# Patient Record
Sex: Female | Born: 1935 | Race: White | Hispanic: No | Smoking: Never smoker
Health system: Southern US, Community
[De-identification: ages and names within clinical notes are randomized; demographics above are authoritative.]

## PROBLEM LIST (undated history)

## (undated) DIAGNOSIS — E785 Hyperlipidemia, unspecified: Secondary | ICD-10-CM

## (undated) DIAGNOSIS — I10 Essential (primary) hypertension: Secondary | ICD-10-CM

## (undated) DIAGNOSIS — I714 Abdominal aortic aneurysm, without rupture, unspecified: Secondary | ICD-10-CM

## (undated) DIAGNOSIS — F0390 Unspecified dementia without behavioral disturbance: Secondary | ICD-10-CM

## (undated) DIAGNOSIS — R1310 Dysphagia, unspecified: Secondary | ICD-10-CM

## (undated) HISTORY — DX: Essential (primary) hypertension: I10

## (undated) HISTORY — DX: Abdominal aortic aneurysm, without rupture: I71.4

## (undated) HISTORY — PX: ABDOMINAL HYSTERECTOMY: SHX81

## (undated) HISTORY — DX: Unspecified dementia, unspecified severity, without behavioral disturbance, psychotic disturbance, mood disturbance, and anxiety: F03.90

## (undated) HISTORY — DX: Dysphagia, unspecified: R13.10

## (undated) HISTORY — DX: Abdominal aortic aneurysm, without rupture, unspecified: I71.40

## (undated) HISTORY — DX: Hyperlipidemia, unspecified: E78.5

---

## 1998-10-15 ENCOUNTER — Other Ambulatory Visit: Admission: RE | Admit: 1998-10-15 | Discharge: 1998-10-15 | Payer: Self-pay | Admitting: Obstetrics and Gynecology

## 1999-05-19 ENCOUNTER — Encounter: Payer: Self-pay | Admitting: Internal Medicine

## 1999-05-19 ENCOUNTER — Encounter: Admission: RE | Admit: 1999-05-19 | Discharge: 1999-05-19 | Payer: Self-pay | Admitting: Internal Medicine

## 1999-11-14 ENCOUNTER — Other Ambulatory Visit: Admission: RE | Admit: 1999-11-14 | Discharge: 1999-11-14 | Payer: Self-pay | Admitting: Obstetrics and Gynecology

## 2000-05-21 ENCOUNTER — Encounter: Admission: RE | Admit: 2000-05-21 | Discharge: 2000-05-21 | Payer: Self-pay | Admitting: Internal Medicine

## 2000-05-21 ENCOUNTER — Encounter: Payer: Self-pay | Admitting: Internal Medicine

## 2000-11-14 ENCOUNTER — Other Ambulatory Visit: Admission: RE | Admit: 2000-11-14 | Discharge: 2000-11-14 | Payer: Self-pay | Admitting: Obstetrics and Gynecology

## 2001-05-22 ENCOUNTER — Encounter: Payer: Self-pay | Admitting: Internal Medicine

## 2001-05-22 ENCOUNTER — Encounter: Admission: RE | Admit: 2001-05-22 | Discharge: 2001-05-22 | Payer: Self-pay | Admitting: Internal Medicine

## 2001-11-29 ENCOUNTER — Other Ambulatory Visit: Admission: RE | Admit: 2001-11-29 | Discharge: 2001-11-29 | Payer: Self-pay | Admitting: Obstetrics and Gynecology

## 2002-05-30 ENCOUNTER — Encounter: Admission: RE | Admit: 2002-05-30 | Discharge: 2002-05-30 | Payer: Self-pay | Admitting: Internal Medicine

## 2002-05-30 ENCOUNTER — Encounter: Payer: Self-pay | Admitting: Internal Medicine

## 2003-06-01 ENCOUNTER — Encounter: Admission: RE | Admit: 2003-06-01 | Discharge: 2003-06-01 | Payer: Self-pay | Admitting: Internal Medicine

## 2004-02-15 ENCOUNTER — Ambulatory Visit (HOSPITAL_COMMUNITY): Admission: RE | Admit: 2004-02-15 | Discharge: 2004-02-15 | Payer: Self-pay | Admitting: Gastroenterology

## 2004-06-02 ENCOUNTER — Encounter: Admission: RE | Admit: 2004-06-02 | Discharge: 2004-06-02 | Payer: Self-pay | Admitting: Internal Medicine

## 2005-07-03 ENCOUNTER — Encounter: Admission: RE | Admit: 2005-07-03 | Discharge: 2005-07-03 | Payer: Self-pay | Admitting: Internal Medicine

## 2006-07-04 ENCOUNTER — Encounter: Admission: RE | Admit: 2006-07-04 | Discharge: 2006-07-04 | Payer: Self-pay | Admitting: Internal Medicine

## 2007-07-08 ENCOUNTER — Encounter: Admission: RE | Admit: 2007-07-08 | Discharge: 2007-07-08 | Payer: Self-pay | Admitting: Internal Medicine

## 2008-07-08 ENCOUNTER — Encounter: Admission: RE | Admit: 2008-07-08 | Discharge: 2008-07-08 | Payer: Self-pay | Admitting: Internal Medicine

## 2009-07-09 ENCOUNTER — Encounter: Admission: RE | Admit: 2009-07-09 | Discharge: 2009-07-09 | Payer: Self-pay | Admitting: Internal Medicine

## 2010-07-11 ENCOUNTER — Encounter
Admission: RE | Admit: 2010-07-11 | Discharge: 2010-07-11 | Payer: Self-pay | Source: Home / Self Care | Attending: Internal Medicine | Admitting: Internal Medicine

## 2010-12-16 NOTE — Op Note (Signed)
NAME:  Amanda Hurley, Amanda Hurley                            ACCOUNT NO.:  [id number]   MEDICAL RECORD NO.:  [id number]                   PATIENT TYPE:  AMB   LOCATION:  ENDO                                 FACILITY:  [facility]   PHYSICIAN:  Danise Edge, M.D.                DATE OF BIRTH:  Dec 02, 1928   DATE OF PROCEDURE:  02/15/2004  DATE OF DISCHARGE:                                 OPERATIVE REPORT   PROCEDURE:  Incomplete colonoscopy.   REFERRED BY:  Candyce Churn, M.D.   INDICATIONS FOR PROCEDURE:  Amanda Hurley is a 77 year old female born  July 31, 1929.  Amanda Hurley is referred for a screening colonoscopy with  polypectomy to prevent colon cancer.   ENDOSCOPIST:  Danise Edge, M.D.   PREMEDICATION:  Versed 6 mg, Demerol 50 mg.   DESCRIPTION OF PROCEDURE:  After obtaining informed consent, Amanda Hurley was  placed in the left lateral decubitus position. I administered intravenous  Demerol and intravenous Versed to achieve conscious sedation for the  procedure. The patient's blood pressure, oxygen saturation and cardiac  rhythm were monitored throughout the procedure and documented in the medical  record.   Anal inspection and digital rectal exam were normal. The Olympus adjustable  pediatric colonoscope was introduced into the rectum and advanced only to  approximately the splenic flexure. Due to sigmoid colon loop formation, I  was unable to complete a full colonoscopy.  Colonic preparation for the exam  today was satisfactory.   Amanda Hurley has left colonic diverticulosis.  Otherwise endoscopic appearance  of the rectum, sigmoid colon and descending colon was normal.   ASSESSMENT:  1. Incomplete colonoscopy to the splenic flexure.  2. Left colonic diverticulosis.  3. I was unable to examine the transverse colon, hepatic flexure, ascending     colon, cecum and ileocecal valve.   RECOMMENDATIONS:  Schedule air contrast barium enema if it is important for  Amanda Hurley to have  visualization of the entire colon.                                               Danise Edge, M.D.    MJ/MEDQ  D:  02/15/2004  T:  02/16/2004  Job:  [id number]   cc:   Candyce Churn, M.D.  [address] [place]  [place] [address]  Fax: [phone]

## 2011-07-07 ENCOUNTER — Other Ambulatory Visit: Payer: Self-pay | Admitting: Internal Medicine

## 2011-07-07 DIAGNOSIS — Z1231 Encounter for screening mammogram for malignant neoplasm of breast: Secondary | ICD-10-CM

## 2011-07-13 ENCOUNTER — Ambulatory Visit
Admission: RE | Admit: 2011-07-13 | Discharge: 2011-07-13 | Disposition: A | Discharge status: Home / Self Care | Payer: Medicare Other | Source: Ambulatory Visit | Attending: Internal Medicine | Admitting: Internal Medicine

## 2011-07-13 DIAGNOSIS — Z1231 Encounter for screening mammogram for malignant neoplasm of breast: Secondary | ICD-10-CM

## 2012-06-06 ENCOUNTER — Other Ambulatory Visit: Payer: Self-pay | Admitting: Internal Medicine

## 2012-06-06 DIAGNOSIS — Z1231 Encounter for screening mammogram for malignant neoplasm of breast: Secondary | ICD-10-CM

## 2012-07-15 ENCOUNTER — Ambulatory Visit
Admission: RE | Admit: 2012-07-15 | Discharge: 2012-07-15 | Disposition: A | Discharge status: Home / Self Care | Payer: Medicare Other | Source: Ambulatory Visit | Attending: Internal Medicine | Admitting: Internal Medicine

## 2012-07-15 DIAGNOSIS — Z1231 Encounter for screening mammogram for malignant neoplasm of breast: Secondary | ICD-10-CM

## 2013-06-16 ENCOUNTER — Other Ambulatory Visit: Payer: Self-pay

## 2013-06-16 DIAGNOSIS — Z1231 Encounter for screening mammogram for malignant neoplasm of breast: Secondary | ICD-10-CM

## 2013-07-16 ENCOUNTER — Ambulatory Visit
Admission: RE | Admit: 2013-07-16 | Discharge: 2013-07-16 | Disposition: A | Discharge status: Home / Self Care | Payer: Medicare Other | Source: Ambulatory Visit

## 2013-07-16 DIAGNOSIS — Z1231 Encounter for screening mammogram for malignant neoplasm of breast: Secondary | ICD-10-CM

## 2014-02-02 ENCOUNTER — Other Ambulatory Visit: Payer: Self-pay | Admitting: Internal Medicine

## 2014-02-02 ENCOUNTER — Ambulatory Visit
Admission: RE | Admit: 2014-02-02 | Discharge: 2014-02-02 | Disposition: A | Discharge status: Home / Self Care | Payer: Medicare Other | Source: Ambulatory Visit | Attending: Internal Medicine | Admitting: Internal Medicine

## 2014-02-02 DIAGNOSIS — R0989 Other specified symptoms and signs involving the circulatory and respiratory systems: Secondary | ICD-10-CM

## 2014-03-09 ENCOUNTER — Other Ambulatory Visit: Payer: Self-pay | Admitting: Internal Medicine

## 2014-03-09 ENCOUNTER — Ambulatory Visit
Admission: RE | Admit: 2014-03-09 | Discharge: 2014-03-09 | Disposition: A | Discharge status: Home / Self Care | Payer: Medicare Other | Source: Ambulatory Visit | Attending: Internal Medicine | Admitting: Internal Medicine

## 2014-03-09 DIAGNOSIS — R9389 Abnormal findings on diagnostic imaging of other specified body structures: Secondary | ICD-10-CM

## 2014-06-18 ENCOUNTER — Other Ambulatory Visit: Payer: Self-pay

## 2014-06-18 DIAGNOSIS — Z1231 Encounter for screening mammogram for malignant neoplasm of breast: Secondary | ICD-10-CM

## 2014-07-20 ENCOUNTER — Ambulatory Visit
Admission: RE | Admit: 2014-07-20 | Discharge: 2014-07-20 | Disposition: A | Discharge status: Home / Self Care | Payer: Medicare Other | Source: Ambulatory Visit

## 2014-07-20 DIAGNOSIS — Z1231 Encounter for screening mammogram for malignant neoplasm of breast: Secondary | ICD-10-CM

## 2014-10-21 ENCOUNTER — Ambulatory Visit (INDEPENDENT_AMBULATORY_CARE_PROVIDER_SITE_OTHER): Payer: Medicare Other | Admitting: Cardiovascular Disease

## 2014-10-21 ENCOUNTER — Encounter: Payer: Self-pay | Admitting: Cardiovascular Disease

## 2014-10-21 VITALS — BP 120/70 | HR 74 | Ht 67.0 in | Wt 146.1 lb

## 2014-10-21 DIAGNOSIS — R079 Chest pain, unspecified: Secondary | ICD-10-CM | POA: Diagnosis not present

## 2014-10-21 NOTE — Patient Instructions (Signed)
Your physician recommends that you continue on your current medications as directed. Please refer to the Current Medication list given to you today.  Follow up with Dr. Nahser as needed. 

## 2014-10-21 NOTE — Progress Notes (Signed)
Cardiology Office Note   Date:  10/21/2014   ID:  Amanda Sleepereggy K Dube, DOB 05/04/1929, MRN [id number]  PCP:  No primary care provider on file.  Cardiologist:   Kelden Lavallee, Deloris PingPhilip J, MD   Chief Complaint  Patient presents with  . Chest Pain   Problem list 1. Chest heaviness 2. Hypertension 3. Hyperlipidemia 4. Mild carotid artery disease 5. Anxiety   October 21, 2014: Amanda Hurley is a 81 y.o. female who presents for evaluation of some chest tightness. She thinks its a lung issue on her right side. She notices some issues when she is lying down.  Has been present for a month or so. Pain is worse at night.  Was started on antacid and has cut back on her OJ and the pains have dramatically improved  Pains never occur with exertion. Almost always occur with lying down.  Lives on her own, still driving, does her own shopping .    Past Medical History  Diagnosis Date  . Hypertension   . Hyperlipidemia   . AAA (abdominal aortic aneurysm)     History reviewed. No pertinent past surgical history.   Current Outpatient Prescriptions  Medication Sig Dispense Refill  . CALCIUM-VITAMIN D PO Take by mouth daily.    . Cranberry 450 MG CAPS Take 450 mg by mouth daily.    Marland Kitchen. losartan-hydrochlorothiazide (HYZAAR) 50-12.5 MG per tablet Take by mouth daily.  11  . Multiple Vitamin (MULTIVITAMIN) tablet Take 1 tablet by mouth daily.    Marland Kitchen. NITROSTAT 0.4 MG SL tablet Take 0.4 mg by mouth daily as needed. For chest pain  1  . Omega-3 Fatty Acids (FISH OIL) 1200 MG CAPS Take 1,200 mg by mouth daily.    Marland Kitchen. omeprazole (PRILOSEC) 40 MG capsule Take 40 mg by mouth daily.  1  . PROAIR HFA 108 (90 BASE) MCG/ACT inhaler Inhale into the lungs daily as needed. 2 puffs every 4-6 hours as needed for wheezing and SOB  0   No current facility-administered medications for this visit.    Allergies:   Review of patient's allergies indicates no known allergies.    Social History:  The patient  reports that she  has never smoked. She does not have any smokeless tobacco history on file. She reports that she drinks alcohol. She reports that she does not use illicit drugs.   Family History:  The patient's family history includes CAD in her father; Cancer - Other in her brother; Dementia in her brother; Hypertension in her mother; Leukemia in her brother.    ROS:  Please see the history of present illness.    Review of Systems: Constitutional:  denies fever, chills, diaphoresis, appetite change and fatigue.  HEENT: denies photophobia, eye pain, redness, hearing loss, ear pain, congestion, sore throat, rhinorrhea, sneezing, neck pain, neck stiffness and tinnitus.  Respiratory: denies SOB, DOE, cough, chest tightness, and wheezing.  Has some allergies and coughs on occasion   Cardiovascular: denies chest pain, palpitations and leg swelling.  Gastrointestinal: denies nausea, vomiting, abdominal pain, diarrhea, constipation, blood in stool.  Genitourinary: denies dysuria, urgency, frequency, hematuria, flank pain and difficulty urinating.  Musculoskeletal: denies  myalgias, back pain, joint swelling, arthralgias and gait problem.   Skin: denies pallor, rash and wound.  Neurological: denies dizziness, seizures, syncope, weakness, light-headedness, numbness and headaches.   Hematological: denies adenopathy, easy bruising, personal or family bleeding history.  Psychiatric/ Behavioral: denies suicidal ideation, mood changes, confusion, nervousness, sleep disturbance and agitation.  All other systems are reviewed and negative.    PHYSICAL EXAM: VS:  BP 120/70 mmHg  Pulse 74  Ht  (1.702 m)  Wt 146 lb 1.9 oz (66.28 kg)  BMI 22.88 kg/m2 , BMI Body mass index is 22.88 kg/(m^2). GEN: Well nourished, well developed, in no acute distress HEENT: normal Neck: no JVD, carotid bruits, or masses Cardiac: RRR; no murmurs, rubs, or gallops,no edema  Respiratory:  clear to auscultation bilaterally, normal  work of breathing GI: soft, nontender, nondistended, + BS MS: no deformity or atrophy Skin: warm and dry, no rash Neuro:  Strength and sensation are intact Psych: normal   EKG:  EKG is ordered today. The ekg ordered today demonstrates NSR at 74.  Normal ecg.    Recent Labs: No results found for requested labs within last 365 days.    Lipid Panel No results found for: CHOL, TRIG, HDL, CHOLHDL, VLDL, LDLCALC, LDLDIRECT    Wt Readings from Last 3 Encounters:  10/21/14 146 lb 1.9 oz (66.28 kg)      Other studies Reviewed: Additional studies/ records that were reviewed today include:  Records from Dr. Kevan Ny Review of the above records demonstrates:  Memory issues, atypical CP    ASSESSMENT AND PLAN:  1.  Atypical chest pain :  This is presents with some atypical chest pains. The chest pains typically would occur at night when she lies down. They were never associated with exertion. The pains have resolved after starting an antacid and after she cut back on the amount of orange juice that she drinks.  Resting EKG is normal.  At this point I think that these pains are GI. I do not think there is any indication to do any further cardiac workup at this time. I'll see her on an as-needed basis.     Current medicines are reviewed at length with the patient today.  The patient does not have concerns regarding medicines.  The following changes have been made:  no change  Labs/ tests ordered today include:  No orders of the defined types were placed in this encounter.     Disposition:   FU with me as needed.     Signed, Travez Stancil, Deloris Ping, MD  10/21/2014 12:04 PM    [facility] [address] [place], [place], [place]  [address] Phone: [phone]; Fax: [phone]

## 2015-06-22 ENCOUNTER — Other Ambulatory Visit: Payer: Self-pay

## 2015-06-22 DIAGNOSIS — Z1231 Encounter for screening mammogram for malignant neoplasm of breast: Secondary | ICD-10-CM

## 2015-07-27 ENCOUNTER — Ambulatory Visit
Admission: RE | Admit: 2015-07-27 | Discharge: 2015-07-27 | Disposition: A | Discharge status: Home / Self Care | Payer: Medicare Other | Source: Ambulatory Visit

## 2015-07-27 DIAGNOSIS — Z1231 Encounter for screening mammogram for malignant neoplasm of breast: Secondary | ICD-10-CM

## 2015-07-28 ENCOUNTER — Other Ambulatory Visit: Payer: Self-pay | Admitting: Internal Medicine

## 2015-07-28 DIAGNOSIS — R928 Other abnormal and inconclusive findings on diagnostic imaging of breast: Secondary | ICD-10-CM

## 2015-08-06 ENCOUNTER — Ambulatory Visit
Admission: RE | Admit: 2015-08-06 | Discharge: 2015-08-06 | Disposition: A | Discharge status: Home / Self Care | Payer: Medicare Other | Source: Ambulatory Visit | Attending: Internal Medicine | Admitting: Internal Medicine

## 2015-08-06 DIAGNOSIS — R928 Other abnormal and inconclusive findings on diagnostic imaging of breast: Secondary | ICD-10-CM

## 2015-09-28 ENCOUNTER — Other Ambulatory Visit: Payer: Self-pay | Admitting: Internal Medicine

## 2015-09-28 DIAGNOSIS — R1310 Dysphagia, unspecified: Secondary | ICD-10-CM

## 2015-10-06 ENCOUNTER — Ambulatory Visit
Admission: RE | Admit: 2015-10-06 | Discharge: 2015-10-06 | Disposition: A | Discharge status: Home / Self Care | Payer: Medicare Other | Source: Ambulatory Visit | Attending: Internal Medicine | Admitting: Internal Medicine

## 2015-10-06 DIAGNOSIS — R1310 Dysphagia, unspecified: Secondary | ICD-10-CM

## 2015-10-15 ENCOUNTER — Encounter (HOSPITAL_COMMUNITY): Payer: Self-pay

## 2015-10-15 ENCOUNTER — Emergency Department (HOSPITAL_COMMUNITY)
Admission: EM | Admit: 2015-10-15 | Discharge: 2015-10-16 | Disposition: A | Discharge status: Home / Self Care | Payer: Medicare Other | Attending: Emergency Medicine | Admitting: Emergency Medicine

## 2015-10-15 DIAGNOSIS — I10 Essential (primary) hypertension: Secondary | ICD-10-CM | POA: Insufficient documentation

## 2015-10-15 DIAGNOSIS — Y998 Other external cause status: Secondary | ICD-10-CM | POA: Diagnosis not present

## 2015-10-15 DIAGNOSIS — S62309A Unspecified fracture of unspecified metacarpal bone, initial encounter for closed fracture: Secondary | ICD-10-CM

## 2015-10-15 DIAGNOSIS — E785 Hyperlipidemia, unspecified: Secondary | ICD-10-CM | POA: Diagnosis not present

## 2015-10-15 DIAGNOSIS — S0990XA Unspecified injury of head, initial encounter: Secondary | ICD-10-CM

## 2015-10-15 DIAGNOSIS — Y9389 Activity, other specified: Secondary | ICD-10-CM | POA: Insufficient documentation

## 2015-10-15 DIAGNOSIS — S62313A Displaced fracture of base of third metacarpal bone, left hand, initial encounter for closed fracture: Secondary | ICD-10-CM | POA: Insufficient documentation

## 2015-10-15 DIAGNOSIS — Z79899 Other long term (current) drug therapy: Secondary | ICD-10-CM | POA: Diagnosis not present

## 2015-10-15 DIAGNOSIS — S0181XA Laceration without foreign body of other part of head, initial encounter: Secondary | ICD-10-CM | POA: Insufficient documentation

## 2015-10-15 DIAGNOSIS — W01198A Fall on same level from slipping, tripping and stumbling with subsequent striking against other object, initial encounter: Secondary | ICD-10-CM | POA: Insufficient documentation

## 2015-10-15 DIAGNOSIS — W19XXXA Unspecified fall, initial encounter: Secondary | ICD-10-CM

## 2015-10-15 DIAGNOSIS — Y92009 Unspecified place in unspecified non-institutional (private) residence as the place of occurrence of the external cause: Secondary | ICD-10-CM | POA: Diagnosis not present

## 2015-10-15 DIAGNOSIS — S62311A Displaced fracture of base of second metacarpal bone. left hand, initial encounter for closed fracture: Secondary | ICD-10-CM | POA: Insufficient documentation

## 2015-10-15 NOTE — ED Notes (Signed)
Per EMS- Pt had unwitnessed fall tonight. Laceration to head- LOC. Pt doesn't remember falling. Generalized weakness. 12 lead unremarkable. No neck or back pain. Hematoma over left orbital. 116/82 76 123cbg. 97.

## 2015-10-15 NOTE — ED Notes (Signed)
Bed: WA04 Expected date:  Expected time:  Means of arrival:  Comments: EMS elderly fall/hematoma

## 2015-10-15 NOTE — ED Notes (Addendum)
Pt. States that she remembers falling, but says she felt weak. Pt states she fell while turning around, lost her balance.

## 2015-10-16 ENCOUNTER — Emergency Department (HOSPITAL_COMMUNITY): Payer: Medicare Other

## 2015-10-16 LAB — CBC WITH DIFFERENTIAL/PLATELET
BASOS ABS: 0 10*3/uL (ref 0.0–0.1)
BASOS PCT: 0 %
EOS PCT: 0 %
Eosinophils Absolute: 0 10*3/uL (ref 0.0–0.7)
HEMATOCRIT: 37 % (ref 36.0–46.0)
Hemoglobin: 12.1 g/dL (ref 12.0–15.0)
LYMPHS PCT: 10 %
Lymphs Abs: 1.7 10*3/uL (ref 0.7–4.0)
MCH: 30 pg (ref 26.0–34.0)
MCHC: 32.7 g/dL (ref 30.0–36.0)
MCV: 91.8 fL (ref 78.0–100.0)
Monocytes Absolute: 1.4 10*3/uL — ABNORMAL HIGH (ref 0.1–1.0)
Monocytes Relative: 8 %
NEUTROS ABS: 13.6 10*3/uL — AB (ref 1.7–7.7)
Neutrophils Relative %: 82 %
PLATELETS: 301 10*3/uL (ref 150–400)
RBC: 4.03 MIL/uL (ref 3.87–5.11)
RDW: 12.7 % (ref 11.5–15.5)
WBC: 16.8 10*3/uL — AB (ref 4.0–10.5)

## 2015-10-16 LAB — BASIC METABOLIC PANEL
ANION GAP: 9 (ref 5–15)
BUN: 23 mg/dL — ABNORMAL HIGH (ref 6–20)
CALCIUM: 9.4 mg/dL (ref 8.9–10.3)
CO2: 25 mmol/L (ref 22–32)
Chloride: 97 mmol/L — ABNORMAL LOW (ref 101–111)
Creatinine, Ser: 1.08 mg/dL — ABNORMAL HIGH (ref 0.44–1.00)
GFR calc Af Amer: 53 mL/min — ABNORMAL LOW (ref >60)
GFR, EST NON AFRICAN AMERICAN: 46 mL/min — AB (ref >60)
Glucose, Bld: 122 mg/dL — ABNORMAL HIGH (ref 65–99)
POTASSIUM: 4.2 mmol/L (ref 3.5–5.1)
SODIUM: 131 mmol/L — AB (ref 135–145)

## 2015-10-16 MED ORDER — LIDOCAINE-EPINEPHRINE 1 %-1:100000 IJ SOLN
10.0000 mL | Freq: Once | INTRAMUSCULAR | Status: AC
  Administered 2015-10-16: 10 mL via INTRADERMAL
  Filled 2015-10-16: qty 1

## 2015-10-16 NOTE — ED Notes (Signed)
Went to draw blood on pt in room 4, when I was stopped by the family.  Family would like to have blood drawn from the IV.  Informed the nurse.

## 2015-10-16 NOTE — ED Provider Notes (Signed)
CSN: [id number]     Arrival date & time 10/15/15  2309 History  By signing my name below, I, Marisue Humble, attest that this documentation has been prepared under the direction and in the presence of Geoffery Lyons, MD . Electronically Signed: Marisue Humble, Scribe. 10/16/2015. 2:59 AM.   Chief Complaint  Patient presents with  . Fall   The history is provided by the patient and a relative. No language interpreter was used.   HPI Comments:  Amanda Hurley is a 82 y.o. female with PMHx of HTN, HLD, and AAA who presents to the Emergency Department via EMS s/p unwitnessed fall earlier tonight complaining of laceration. Pt states she was turning around when she lost her balance and fell. She hit her head on a door and slid to the floor. Family reports similar fall ~1 wk ago due to loss of balance while in the yard. Pt reports associated soreness in left hand. Pt finished an antibiotic regimen for UTI ~1 wk ago; relative also reports pt had swallow study done recently to evaluate possible diverticulum in esophagus. Pt denies syncope, neck pain, headache, hip or knee pain, use of blood thinners, or difficulty breathing.  Past Medical History  Diagnosis Date  . Hypertension   . Hyperlipidemia   . AAA (abdominal aortic aneurysm) (HCC)    History reviewed. No pertinent past surgical history. Family History  Problem Relation Age of Onset  . Hypertension Mother   . CAD Father   . Dementia Brother   . Cancer - Other Brother   . Leukemia Brother    Social History  Substance Use Topics  . Smoking status: Never Smoker   . Smokeless tobacco: None  . Alcohol Use: 0.0 oz/week    0 Standard drinks or equivalent per week     Comment: HISTORY OF   OB History    No data available     Review of Systems  Respiratory: Negative for shortness of breath.   Musculoskeletal: Positive for arthralgias (left hand). Negative for neck pain.  Skin: Positive for wound (above left eye).  Neurological:  Negative for syncope and headaches.  All other systems reviewed and are negative.  Allergies  Review of patient's allergies indicates no known allergies.  Home Medications   Prior to Admission medications   Medication Sig Start Date End Date Taking? Authorizing Provider  Cranberry 450 MG CAPS Take 450 mg by mouth daily.   Yes Historical Provider, MD  losartan-hydrochlorothiazide Mauri Reading) 50-12.5 MG per tablet Take 1 tablet by mouth every Monday, Wednesday, and Friday.  10/03/14  Yes Historical Provider, MD  Multiple Vitamin (MULTIVITAMIN) tablet Take 1 tablet by mouth daily.   Yes Historical Provider, MD  Omega-3 Fatty Acids (FISH OIL) 1200 MG CAPS Take 1,200 mg by mouth daily.   Yes Historical Provider, MD  omeprazole (PRILOSEC) 40 MG capsule Take 40 mg by mouth daily. 09/21/14  Yes Historical Provider, MD  NITROSTAT 0.4 MG SL tablet Take 0.4 mg by mouth daily as needed. For chest pain 09/21/14   Historical Provider, MD   BP 128/83 mmHg  Pulse 87  Temp(Src) 97.5 F (36.4 C) (Oral)  Resp 18  SpO2 98% Physical Exam  Constitutional: She is oriented to person, place, and time. She appears well-developed and well-nourished. No distress.  HENT:  Head: Normocephalic.  Mouth/Throat: Oropharynx is clear and moist. No oropharyngeal exudate.  2.5 cm laceration above the left eyebrow.  Eyes: Conjunctivae and EOM are normal. Pupils are equal, round, and  reactive to light. Right eye exhibits no discharge. Left eye exhibits no discharge. No scleral icterus.  Neck: Normal range of motion. Neck supple. No JVD present. No thyromegaly present.  Cardiovascular: Normal rate, regular rhythm, normal heart sounds and intact distal pulses.  Exam reveals no gallop and no friction rub.   No murmur heard. Pulmonary/Chest: Effort normal and breath sounds normal. No respiratory distress. She has no wheezes. She has no rales.  Abdominal: Soft. Bowel sounds are normal. She exhibits no distension and no mass. There is  no tenderness.  Musculoskeletal: Normal range of motion. She exhibits no edema or tenderness.  Lymphadenopathy:    She has no cervical adenopathy.  Neurological: She is alert and oriented to person, place, and time. No cranial nerve deficit. Coordination normal.  Skin: Skin is warm and dry. No rash noted. She is not diaphoretic. No erythema.  Psychiatric: She has a normal mood and affect. Her behavior is normal.  Nursing note and vitals reviewed.  ED Course  Procedures  DIAGNOSTIC STUDIES:  Oxygen Saturation is 98% on RA, normal by my interpretation.    COORDINATION OF CARE:  12:38 AM Will order x-ray of left hand, head CT and blood work. Discussed treatment plan with pt at bedside and pt agreed to plan.  1:46 AM LACERATION REPAIR Performed by: Geoffery Lyonsouglas Varnell Donate, MD Consent: Verbal consent obtained. Risks and benefits: risks, benefits and alternatives were discussed Patient identity confirmed: provided demographic data Time out performed prior to procedure Prepped and Draped in normal sterile fashion Wound explored Laceration Location: above left eyebrow Laceration Length: 2.5 cm No Foreign Bodies seen or palpated Anesthesia: local infiltration Local anesthetic: lidocaine 1% 1:00000 epinephrine Anesthetic total: 10 ml Irrigation method: syringe Amount of cleaning: standard Skin closure: 6 o prolene Number of sutures or staples: 3 Technique: simple interrupted  Patient tolerance: Patient tolerated the procedure well with no immediate complications.   Labs Review Labs Reviewed  BASIC METABOLIC PANEL - Abnormal; Notable for the following:    Sodium 131 (*)    Chloride 97 (*)    Glucose, Bld 122 (*)    BUN 23 (*)    Creatinine, Ser 1.08 (*)    GFR calc non Af Amer 46 (*)    GFR calc Af Amer 53 (*)    All other components within normal limits  CBC WITH DIFFERENTIAL/PLATELET - Abnormal; Notable for the following:    WBC 16.8 (*)    Neutro Abs 13.6 (*)    Monocytes  Absolute 1.4 (*)    All other components within normal limits    Imaging Review Ct Head Wo Contrast  10/16/2015  CLINICAL DATA:  Status post unwitnessed fall, with laceration to the head. Loss of consciousness. Generalized weakness. Initial encounter. EXAM: CT HEAD WITHOUT CONTRAST TECHNIQUE: Contiguous axial images were obtained from the base of the skull through the vertex without intravenous contrast. COMPARISON:  None. FINDINGS: There is no evidence of acute infarction, mass lesion, or intra- or extra-axial hemorrhage on CT. Prominence of the ventricles and sulci reflects moderate cortical volume loss. Cerebellar atrophy is noted. Diffuse periventricular and subcortical white matter change likely reflects small vessel ischemic microangiopathy. A chronic lacunar infarct is noted at the right basal ganglia. The brainstem and fourth ventricle are within normal limits. The cerebral hemispheres demonstrate grossly normal gray-white differentiation. No mass effect or midline shift is seen. There is no evidence of fracture; visualized osseous structures are unremarkable in appearance. The visualized portions of the orbits are within  normal limits. The paranasal sinuses and mastoid air cells are well-aerated. Mild soft tissue swelling is noted overlying the left frontal calvarium. IMPRESSION: 1. No evidence of traumatic intracranial injury or fracture. 2. Mild soft tissue swelling overlying the left frontal calvarium. 3. Moderate cortical volume loss and diffuse small vessel ischemic microangiopathy. 4. Chronic lacunar infarct at the right basal ganglia. Electronically Signed   By: Roanna Raider M.D.   On: 10/16/2015 02:13   Dg Hand Complete Left  10/16/2015  CLINICAL DATA:  Unwitnessed fall tonight. Pain and erythema to the left hand. EXAM: LEFT HAND - COMPLETE 3+ VIEW COMPARISON:  None. FINDINGS: There are acute nondisplaced fractures of the bases of the second and third metacarpals, probably intra-articular  at the carpometacarpal articulations. No dislocation is evident. There is no radiopaque foreign body. There are osteoarthritic changes of the first [facility] joint. IMPRESSION: Acute fractures of the second and third metacarpal bases. Electronically Signed   By: Ellery Plunk M.D.   On: 10/16/2015 01:55   I have personally reviewed and evaluated these images and lab results as part of my medical decision-making.    MDM   Final diagnoses:  Fall, initial encounter  Forehead laceration, initial encounter  Closed head injury, initial encounter  Metacarpal bone fracture, closed, initial encounter    Patient presents after a fall at home. She states that she turned too quickly, lost her balance, then fell forward striking her head on the door. She denies any loss of consciousness and denies any neck pain. Her neurologic exam is nonfocal and there is no tenderness of the cervical spine. She has painless range of motion in all directions and her C-spine was cleared clinically. A head CT was obtained which was unremarkable for bleed or fracture. She was found to have fractures of the second and third metacarpal of her left hand. These were splinted and will be followed up by hand surgery. Her forehead laceration was repaired. She appears very appropriate for discharge. To return as needed for any problems.  I personally performed the services described in this documentation, which was scribed in my presence. The recorded information has been reviewed and is accurate.       Geoffery Lyons, MD 10/16/15 [id number]

## 2015-10-16 NOTE — Progress Notes (Signed)
Orthopedic Tech Progress Note Patient Details:  Amanda Hurley 10/09/1928 [id number]  Ortho Devices Type of Ortho Device: Volar splint Ortho Device/Splint Location: lue Ortho Device/Splint Interventions: Ordered, Application   [name], Amanda Hurley 10/16/2015, 3:41 AM

## 2015-10-16 NOTE — Discharge Instructions (Signed)
Local wound care with bacitracin and dressing changes twice daily.  Sutures are to be removed in 5 days. Please see your primary doctor for this.  Follow-up with Dr. Melvyn Novasrtmann in the surgery clinic this week for further treatment of your hand fractures.  Wear the splint as applied in the meantime. Ice your hand for 20 minutes every 2 hours while awake for the next 2 days.   Facial Laceration  A facial laceration is a cut on the face. These injuries can be painful and cause bleeding. Lacerations usually heal quickly, but they need special care to reduce scarring. DIAGNOSIS  Your health care provider will take a medical history, ask for details about how the injury occurred, and examine the wound to determine how deep the cut is. TREATMENT  Some facial lacerations may not require closure. Others may not be able to be closed because of an increased risk of infection. The risk of infection and the chance for successful closure will depend on various factors, including the amount of time since the injury occurred. The wound may be cleaned to help prevent infection. If closure is appropriate, pain medicines may be given if needed. Your health care provider will use stitches (sutures), wound glue (adhesive), or skin adhesive strips to repair the laceration. These tools bring the skin edges together to allow for faster healing and a better cosmetic outcome. If needed, you may also be given a tetanus shot. HOME CARE INSTRUCTIONS  Only take over-the-counter or prescription medicines as directed by your health care provider.  Follow your health care provider's instructions for wound care. These instructions will vary depending on the technique used for closing the wound. For Sutures:  Keep the wound clean and dry.   If you were given a bandage (dressing), you should change it at least once a day. Also change the dressing if it becomes wet or dirty, or as directed by your health care provider.   Wash  the wound with soap and water 2 times a day. Rinse the wound off with water to remove all soap. Pat the wound dry with a clean towel.   After cleaning, apply a thin layer of the antibiotic ointment recommended by your health care provider. This will help prevent infection and keep the dressing from sticking.   You may shower as usual after the first 24 hours. Do not soak the wound in water until the sutures are removed.   Get your sutures removed as directed by your health care provider. With facial lacerations, sutures should usually be taken out after 4-5 days to avoid stitch marks.   Wait a few days after your sutures are removed before applying any makeup. For Skin Adhesive Strips:  Keep the wound clean and dry.   Do not get the skin adhesive strips wet. You may bathe carefully, using caution to keep the wound dry.   If the wound gets wet, pat it dry with a clean towel.   Skin adhesive strips will fall off on their own. You may trim the strips as the wound heals. Do not remove skin adhesive strips that are still stuck to the wound. They will fall off in time.  For Wound Adhesive:  You may briefly wet your wound in the shower or bath. Do not soak or scrub the wound. Do not swim. Avoid periods of heavy sweating until the skin adhesive has fallen off on its own. After showering or bathing, gently pat the wound dry with a clean towel.  Do not apply liquid medicine, cream medicine, ointment medicine, or makeup to your wound while the skin adhesive is in place. This may loosen the film before your wound is healed.   If a dressing is placed over the wound, be careful not to apply tape directly over the skin adhesive. This may cause the adhesive to be pulled off before the wound is healed.   Avoid prolonged exposure to sunlight or tanning lamps while the skin adhesive is in place.  The skin adhesive will usually remain in place for 5-10 days, then naturally fall off the skin. Do  not pick at the adhesive film.  After Healing: Once the wound has healed, cover the wound with sunscreen during the day for 1 full year. This can help minimize scarring. Exposure to ultraviolet light in the first year will darken the scar. It can take 1-2 years for the scar to lose its redness and to heal completely.  SEEK MEDICAL CARE IF:  You have a fever. SEEK IMMEDIATE MEDICAL CARE IF:  You have redness, pain, or swelling around the wound.   You see ayellowish-white fluid (pus) coming from the wound.    This information is not intended to replace advice given to you by your health care provider. Make sure you discuss any questions you have with your health care provider.   Document Released: 08/24/2004 Document Revised: 08/07/2014 Document Reviewed: 02/27/2013 Elsevier Interactive Patient Education [employer]

## 2016-02-11 ENCOUNTER — Emergency Department (HOSPITAL_COMMUNITY): Payer: Medicare Other

## 2016-02-11 ENCOUNTER — Emergency Department (HOSPITAL_COMMUNITY)
Admission: EM | Admit: 2016-02-11 | Discharge: 2016-02-11 | Disposition: A | Discharge status: Home / Self Care | Payer: Medicare Other | Attending: Emergency Medicine | Admitting: Emergency Medicine

## 2016-02-11 ENCOUNTER — Encounter (HOSPITAL_COMMUNITY): Payer: Self-pay | Admitting: Emergency Medicine

## 2016-02-11 DIAGNOSIS — M545 Low back pain, unspecified: Secondary | ICD-10-CM

## 2016-02-11 DIAGNOSIS — Z7982 Long term (current) use of aspirin: Secondary | ICD-10-CM | POA: Diagnosis not present

## 2016-02-11 DIAGNOSIS — Z79899 Other long term (current) drug therapy: Secondary | ICD-10-CM | POA: Diagnosis not present

## 2016-02-11 DIAGNOSIS — I10 Essential (primary) hypertension: Secondary | ICD-10-CM | POA: Insufficient documentation

## 2016-02-11 DIAGNOSIS — W1839XA Other fall on same level, initial encounter: Secondary | ICD-10-CM | POA: Diagnosis not present

## 2016-02-11 DIAGNOSIS — Y999 Unspecified external cause status: Secondary | ICD-10-CM | POA: Diagnosis not present

## 2016-02-11 DIAGNOSIS — Y9289 Other specified places as the place of occurrence of the external cause: Secondary | ICD-10-CM | POA: Insufficient documentation

## 2016-02-11 DIAGNOSIS — Y9389 Activity, other specified: Secondary | ICD-10-CM | POA: Diagnosis not present

## 2016-02-11 DIAGNOSIS — W19XXXA Unspecified fall, initial encounter: Secondary | ICD-10-CM

## 2016-02-11 LAB — URINALYSIS, ROUTINE W REFLEX MICROSCOPIC
BILIRUBIN URINE: NEGATIVE
Glucose, UA: NEGATIVE mg/dL
Hgb urine dipstick: NEGATIVE
KETONES UR: NEGATIVE mg/dL
Nitrite: NEGATIVE
PROTEIN: NEGATIVE mg/dL
Specific Gravity, Urine: 1.018 (ref 1.005–1.030)
pH: 6 (ref 5.0–8.0)

## 2016-02-11 LAB — URINE MICROSCOPIC-ADD ON

## 2016-02-11 MED ORDER — ACETAMINOPHEN 325 MG PO TABS: 650.0000 mg | ORAL_TABLET | Freq: Four times a day (QID) | ORAL | Status: DC | PRN

## 2016-02-11 MED ORDER — HYDROCODONE-ACETAMINOPHEN 5-325 MG PO TABS: 0.5000 | ORAL_TABLET | Freq: Four times a day (QID) | ORAL | Status: DC | PRN

## 2016-02-11 MED ORDER — ACETAMINOPHEN 325 MG PO TABS
650.0000 mg | ORAL_TABLET | Freq: Once | ORAL | Status: AC
  Administered 2016-02-11: 650 mg via ORAL
  Filled 2016-02-11: qty 2

## 2016-02-11 NOTE — ED Notes (Signed)
Pt c/o lower back pain x3 days after falling while working out in the yard. Denies urinary sx, head injury and LOC.

## 2016-02-11 NOTE — ED Notes (Signed)
Patient ambulated well with standby assistance from staff.

## 2016-02-11 NOTE — Discharge Instructions (Signed)
Amanda Hurley,  Nice meeting you! Please follow-up with orthopedics. Return to the emergency department if you develop loss of bladder/bowel control, increased pain, inability to walk, fevers, chills, nausea/vomiting, new/worsening symptoms. Feel better soon!  S. Lane HackerNicole Elleana Stillson, PA-C

## 2016-02-13 LAB — URINE CULTURE

## 2016-03-12 NOTE — ED Provider Notes (Signed)
[facility]-EMERGENCY DEPT Provider Note   CSN: [id number] Arrival date & time: 02/11/16  1120  First Provider Contact:  First MD Initiated Contact with Patient 02/11/16 1353        History   Chief Complaint Chief Complaint  Patient presents with  . Back Pain    HPIHPI  Amanda Hurley is a 82 y.o. female PMH significant for AAA, HLD, HTN presenting with a 3 day history of lower back pain. She states her pain began while she was working out in the yard and she fell, landing on her tailbone. She describes her pain as 8/10 pain scale, worse with palpation and certain movements, intermittent, achy, nonradiating. She denies fevers, chills, urinary symptoms, injury elsewhere, LOC.   Past Medical History:  Diagnosis Date  . AAA (abdominal aortic aneurysm) (HCC)   . Hyperlipidemia   . Hypertension     There are no active problems to display for this patient.   History reviewed. No pertinent surgical history.  OB History    No data available       Home Medications    Prior to Admission medications   Medication Sig Start Date End Date Taking? Authorizing Provider  aspirin EC 325 MG tablet Take 325 mg by mouth daily as needed for mild pain or moderate pain.   Yes Historical Provider, MD  Calcium Carb-Cholecalciferol (CALCIUM 600+D3) 600-800 MG-UNIT TABS Take 1 tablet by mouth daily.   Yes Historical Provider, MD  Cranberry 450 MG CAPS Take 450 mg by mouth daily.   Yes Historical Provider, MD  ibuprofen (ADVIL,MOTRIN) 800 MG tablet Take 800 mg by mouth every 8 (eight) hours as needed for moderate pain.   Yes Historical Provider, MD  losartan-hydrochlorothiazide Mauri Reading) 50-12.5 MG per tablet Take 1 tablet by mouth every Monday, Wednesday, and Friday.  10/03/14  Yes Historical Provider, MD  Multiple Vitamin (MULTIVITAMIN) tablet Take 1 tablet by mouth daily.   Yes Historical Provider, MD  NITROSTAT 0.4 MG SL tablet Take 0.4 mg by mouth daily as needed. For chest pain 09/21/14  Yes Historical  Provider, MD  Omega-3 Fatty Acids (FISH OIL) 1200 MG CAPS Take 1,200 mg by mouth daily.   Yes Historical Provider, MD  omeprazole (PRILOSEC OTC) 20 MG tablet Take 20 mg by mouth daily.   Yes Historical Provider, MD  acetaminophen (TYLENOL) 325 MG tablet Take 2 tablets (650 mg total) by mouth every 6 (six) hours as needed. 02/11/16   Melton Krebs, PA-C  HYDROcodone-acetaminophen (NORCO/VICODIN) 5-325 MG tablet Take 0.5 tablets by mouth every 6 (six) hours as needed. 02/11/16   Melton Krebs, PA-C    Family History Family History  Problem Relation Age of Onset  . Hypertension Mother   . CAD Father   . Dementia Brother   . Cancer - Other Brother   . Leukemia Brother     Social History Social History  Substance Use Topics  . Smoking status: Never Smoker  . Smokeless tobacco: Not on file  . Alcohol use 0.0 oz/week     Comment: HISTORY OF     Allergies   Review of patient's allergies indicates no known allergies.   Review of Systems Review of Systems  Ten systems are reviewed and are negative for acute change except as noted in the HPI   Physical Exam Updated Vital Signs BP 149/87 (BP Location: Left Arm)   Pulse 75   Temp 97.8 F (36.6 C)   Resp 16   SpO2 100%  Physical Exam  Constitutional: She appears well-developed and well-nourished. No distress.  HENT:  Head: Normocephalic and atraumatic.  Mouth/Throat: Oropharynx is clear and moist. No oropharyngeal exudate.  Eyes: Conjunctivae are normal. Pupils are equal, round, and reactive to light. Right eye exhibits no discharge. Left eye exhibits no discharge. No scleral icterus.  Neck: No tracheal deviation present.  Cardiovascular: Normal rate, regular rhythm, normal heart sounds and intact distal pulses.  Exam reveals no gallop and no friction rub.   No murmur heard. Pulmonary/Chest: Effort normal and breath sounds normal. No respiratory distress. She has no wheezes. She has no rales. She exhibits no  tenderness.  Abdominal: Soft. Bowel sounds are normal. She exhibits no distension and no mass. There is no tenderness. There is no rebound and no guarding.  Musculoskeletal: She exhibits no edema.  Strength 5/5 throughout. NVI BL.   Lymphadenopathy:    She has no cervical adenopathy.  Neurological: She is alert. Coordination normal.  Cranial nerves 2-12 grossly intact. Normal Romberg, finger to nose, pronator drift, ambulation.  Skin: Skin is warm and dry. No rash noted. She is not diaphoretic. No erythema.  Psychiatric: She has a normal mood and affect. Her behavior is normal.  Nursing note and vitals reviewed.    ED Treatments / Results  Labs (all labs ordered are listed, but only abnormal results are displayed) Labs Reviewed  URINE CULTURE - Abnormal; Notable for the following:       Result Value   Culture MULTIPLE SPECIES PRESENT, SUGGEST RECOLLECTION (*)    All other components within normal limits  URINALYSIS, ROUTINE W REFLEX MICROSCOPIC (NOT AT [facility]) - Abnormal; Notable for the following:    APPearance CLOUDY (*)    Leukocytes, UA LARGE (*)    All other components within normal limits  URINE MICROSCOPIC-ADD ON - Abnormal; Notable for the following:    Squamous Epithelial / LPF 6-30 (*)    Bacteria, UA RARE (*)    All other components within normal limits    EKG  EKG Interpretation None       Radiology No results found.  Procedures Procedures (including critical care time)  Medications Ordered in ED Medications  acetaminophen (TYLENOL) tablet 650 mg (650 mg Oral Given 02/11/16 1505)     Initial Impression / Assessment and Plan / ED Course  I have reviewed the triage vital signs and the nursing notes.  Pertinent labs & imaging results that were available during my care of the patient were reviewed by me and considered in my medical decision making (see chart for details).  Clinical Course     Final Clinical Impressions(s) / ED Diagnoses   Final  diagnoses:  Fall  Midline low back pain without sciatica   Patient with back pain and compression fracture of indeterminate age. Patient  No neurological deficits and normal neuro exam.  Patient can walk but states is painful.  No loss of bowel or bladder control.  No concern for cauda equina.  No fever, night sweats, weight loss, h/o cancer, IVDU.  RICE protocol and pain medicine indicated and discussed with patient. Patient to f/u with ortho.  Patient requested UA- results borderline and patient without symptoms. Will culture and hold off on rx right now.   New Prescriptions Discharge Medication List as of 02/11/2016  4:09 PM    START taking these medications   Details  HYDROcodone-acetaminophen (NORCO/VICODIN) 5-325 MG tablet Take 0.5 tablets by mouth every 6 (six) hours as needed., Starting 02/11/2016, Until Discontinued,  Print         Melton KrebsSamantha Nicole Haygen Zebrowski, [place] JerseyPA-C 03/12/16 2102    Tilden FossaElizabeth Rees, MD 03/15/16 [phone]

## 2016-07-03 ENCOUNTER — Encounter (HOSPITAL_COMMUNITY): Payer: Self-pay

## 2016-07-04 ENCOUNTER — Encounter (HOSPITAL_COMMUNITY)
Admission: RE | Disposition: A | Discharge status: Home / Self Care | Payer: Self-pay | Source: Ambulatory Visit | Attending: Gastroenterology

## 2016-07-04 ENCOUNTER — Other Ambulatory Visit: Payer: Self-pay | Admitting: Gastroenterology

## 2016-07-04 ENCOUNTER — Ambulatory Visit (HOSPITAL_COMMUNITY): Payer: Medicare Other | Admitting: Certified Registered Nurse Anesthetist

## 2016-07-04 ENCOUNTER — Encounter (HOSPITAL_COMMUNITY): Payer: Self-pay

## 2016-07-04 ENCOUNTER — Ambulatory Visit (HOSPITAL_COMMUNITY)
Admission: RE | Admit: 2016-07-04 | Discharge: 2016-07-04 | Disposition: A | Discharge status: Home / Self Care | Payer: Medicare Other | Source: Ambulatory Visit | Attending: Gastroenterology | Admitting: Gastroenterology

## 2016-07-04 DIAGNOSIS — Q396 Congenital diverticulum of esophagus: Secondary | ICD-10-CM | POA: Diagnosis not present

## 2016-07-04 DIAGNOSIS — K449 Diaphragmatic hernia without obstruction or gangrene: Secondary | ICD-10-CM | POA: Diagnosis not present

## 2016-07-04 DIAGNOSIS — I10 Essential (primary) hypertension: Secondary | ICD-10-CM | POA: Insufficient documentation

## 2016-07-04 DIAGNOSIS — Z79899 Other long term (current) drug therapy: Secondary | ICD-10-CM | POA: Diagnosis not present

## 2016-07-04 DIAGNOSIS — J302 Other seasonal allergic rhinitis: Secondary | ICD-10-CM | POA: Diagnosis not present

## 2016-07-04 DIAGNOSIS — R1310 Dysphagia, unspecified: Secondary | ICD-10-CM | POA: Insufficient documentation

## 2016-07-04 DIAGNOSIS — E785 Hyperlipidemia, unspecified: Secondary | ICD-10-CM | POA: Diagnosis not present

## 2016-07-04 HISTORY — PX: ESOPHAGOGASTRODUODENOSCOPY (EGD) WITH PROPOFOL: SHX5813

## 2016-07-04 HISTORY — PX: BOTOX INJECTION: SHX5754

## 2016-07-04 SURGERY — ESOPHAGOGASTRODUODENOSCOPY (EGD) WITH PROPOFOL
Anesthesia: Monitor Anesthesia Care

## 2016-07-04 MED ORDER — ONDANSETRON HCL 4 MG/2ML IJ SOLN
INTRAMUSCULAR | Status: AC
  Filled 2016-07-04: qty 2

## 2016-07-04 MED ORDER — PROPOFOL 500 MG/50ML IV EMUL
INTRAVENOUS | Status: DC | PRN
  Administered 2016-07-04: 75 ug/kg/min via INTRAVENOUS

## 2016-07-04 MED ORDER — LIDOCAINE 2% (20 MG/ML) 5 ML SYRINGE
INTRAMUSCULAR | Status: AC
  Filled 2016-07-04: qty 5

## 2016-07-04 MED ORDER — PROPOFOL 10 MG/ML IV BOLUS
INTRAVENOUS | Status: DC | PRN
  Administered 2016-07-04 (×2): 10 mg via INTRAVENOUS
  Administered 2016-07-04 (×5): 20 mg via INTRAVENOUS
  Administered 2016-07-04 (×2): 10 mg via INTRAVENOUS

## 2016-07-04 MED ORDER — ONDANSETRON HCL 4 MG/2ML IJ SOLN
INTRAMUSCULAR | Status: DC | PRN
Start: 2016-07-04 — End: 2016-07-04
  Administered 2016-07-04: 4 mg via INTRAVENOUS

## 2016-07-04 MED ORDER — LACTATED RINGERS IV SOLN
INTRAVENOUS | Status: DC | PRN
  Administered 2016-07-04: 12:00:00 via INTRAVENOUS

## 2016-07-04 MED ORDER — SODIUM CHLORIDE 0.9 % IJ SOLN
INTRAMUSCULAR | Status: AC
  Filled 2016-07-04: qty 10

## 2016-07-04 MED ORDER — ONABOTULINUMTOXINA 100 UNITS IJ SOLR
INTRAMUSCULAR | Status: AC
  Filled 2016-07-04: qty 100

## 2016-07-04 MED ORDER — PROPOFOL 10 MG/ML IV BOLUS
INTRAVENOUS | Status: AC
  Filled 2016-07-04: qty 60

## 2016-07-04 MED ORDER — LIDOCAINE 2% (20 MG/ML) 5 ML SYRINGE
INTRAMUSCULAR | Status: DC | PRN
  Administered 2016-07-04: 100 mg via INTRAVENOUS

## 2016-07-04 MED ORDER — ONABOTULINUMTOXINA 100 UNITS IJ SOLR
INTRAMUSCULAR | Status: DC | PRN
  Administered 2016-07-04: 4 mL via SUBMUCOSAL

## 2016-07-04 SURGICAL SUPPLY — 15 items

## 2016-07-04 NOTE — Anesthesia Preprocedure Evaluation (Signed)
Anesthesia Evaluation  Patient identified by MRN, date of birth, ID band Patient awake    Reviewed: Allergy & Precautions, NPO status , Patient's Chart, lab work & pertinent test results  Airway Mallampati: II  TM Distance: >3 FB Neck ROM: Full    Dental no notable dental hx.    Pulmonary neg pulmonary ROS,    Pulmonary exam normal breath sounds clear to auscultation       Cardiovascular hypertension, Pt. on medications Normal cardiovascular exam Rhythm:Regular Rate:Normal     Neuro/Psych negative neurological ROS  negative psych ROS   GI/Hepatic negative GI ROS, Neg liver ROS,   Endo/Other  negative endocrine ROS  Renal/GU negative Renal ROS  negative genitourinary   Musculoskeletal negative musculoskeletal ROS (+)   Abdominal   Peds negative pediatric ROS (+)  Hematology negative hematology ROS (+)   Anesthesia Other Findings   Reproductive/Obstetrics negative OB ROS                            Anesthesia Physical Anesthesia Plan  ASA: II  Anesthesia Plan: MAC   Post-op Pain Management:    Induction: Intravenous  Airway Management Planned: Nasal Cannula  Additional Equipment:   Intra-op Plan:   Post-operative Plan:   Informed Consent: I have reviewed the patients History and Physical, chart, labs and discussed the procedure including the risks, benefits and alternatives for the proposed anesthesia with the patient or authorized representative who has indicated his/her understanding and acceptance.   Dental advisory given  Plan Discussed with: CRNA  Anesthesia Plan Comments:         Anesthesia Quick Evaluation  

## 2016-07-04 NOTE — Transfer of Care (Signed)
Immediate Anesthesia Transfer of Care Note  Patient: Amanda Hurley  Procedure(s) Performed: Procedure(s): ESOPHAGOGASTRODUODENOSCOPY (EGD) WITH PROPOFOL (N/A) BOTOX INJECTION (N/A)  Patient Location: ENDO  Anesthesia Type:MAC  Level of Consciousness:  sedated, patient cooperative and responds to stimulation  Airway & Oxygen Therapy:Patient Spontanous Breathing and Patient connected to face mask oxgen  Post-op Assessment:  Report given to ENDO RN and Post -op Vital signs reviewed and stable  Post vital signs:  Reviewed and stable  Last Vitals:  Vitals:   07/04/16 1110 07/04/16 1213  BP: 124/74 (P) 108/69  Pulse: 77   Resp: 19   Temp: 36.7 C     Complications: No apparent anesthesia complications

## 2016-07-04 NOTE — Op Note (Signed)
[facility] Patient Name: Amanda Hurley Procedure Date: 07/04/2016 MRN: [id number] Attending MD: Vida RiggerMarc Essa Malachi , MD Date of Birth: 11/12/1928 CSN: [id number] Age: 82 Admit Type: Outpatient Procedure:                Upper GI endoscopy Indications:              Dysphagia abnormal barium swallow known distal                            duodenal diverticulum Providers:                Vida RiggerMarc Kaisha Wachob, MD, Janae SauceKaren D. Steele BergHinson, RN, Darletta MollJackie Aiken                            Tech, Technician, Waymond CeraJosh Jarvela, CRNA Referring MD:              Medicines:                Propofol total dose 200 mg IV,100 mg IV lidocaine 4                            mg IV Zofran Complications:            No immediate complications. Estimated Blood Loss:     Estimated blood loss: none. Procedure:                Pre-Anesthesia Assessment:                           - Prior to the procedure, a History and Physical                            was performed, and patient medications and                            allergies were reviewed. The patient's tolerance of                            previous anesthesia was also reviewed. The risks                            and benefits of the procedure and the sedation                            options and risks were discussed with the patient.                            All questions were answered, and informed consent                            was obtained. Prior Anticoagulants: The patient has                            taken no previous anticoagulant or antiplatelet  agents. ASA Grade Assessment: II - A patient with                            mild systemic disease. After reviewing the risks                            and benefits, the patient was deemed in                            satisfactory condition to undergo the procedure.                           After obtaining informed consent, the endoscope was                            passed under direct  vision. Throughout the                            procedure, the patient's blood pressure, pulse, and                            oxygen saturations were monitored continuously. The                            [id number]) scope was introduced through the                            mouth, and advanced to the second part of duodenum.                            The upper GI endoscopy was accomplished without                            difficulty. The patient tolerated the procedure                            well. Findings:      A non-bleeding diverticulum with a large opening and no stigmata of       recent bleeding was found in the lower third of the esophagus.      A [Tonus] lower esophageal sphincter was found. [Resistance]. [Z line].       [Retroflexion Normal]. Area was successfully injected with 100 units       botulinum toxin.      The entire examined stomach was normal.      The duodenal bulb, first portion of the duodenum and second portion of       the duodenum were normal.      The exam was otherwise without abnormality.      Food was found in the lower third of the esophagus. Lavage of the area       was performed using a small amount of sterile water, resulting in       clearance with adequate visualization.      A small hiatal hernia was present. Impression:               -  Diverticulum in the lower third of the esophagus.                           - Normal stomach.                           - Normal duodenal bulb, first portion of the                            duodenum and second portion of the duodenum.                           - The examination was otherwise normal.                           - Food in the lower third of the esophagus.                           - Small hiatal hernia.                           - No specimens collected. Moderate Sedation:      N/A- Per Anesthesia Care Recommendation:           - Patient has a contact number available for                             emergencies. The signs and symptoms of potential                            delayed complications were discussed with the                            patient. Return to normal activities tomorrow.                            Written discharge instructions were provided to the                            patient.                           - Clear liquid diet for 4 hours.                           - Continue present medications.                           - Return to GI clinic PRN.                           - Telephone GI clinic if symptomatic PRN. Procedure Code(s):        --- Professional ---                           [phone], Esophagogastroduodenoscopy, flexible,  transoral; with directed submucosal injection(s),                            any substance Diagnosis Code(s):        --- Professional ---                           Q39.6, Congenital diverticulum of esophagus                           R13.10, Dysphagia, unspecified CPT copyright 2016 American Medical Association. All rights reserved. The codes documented in this report are preliminary and upon coder review may  be revised to meet current compliance requirements. Vida Rigger, MD 07/04/2016 12:08:46 PM This report has been signed electronically. Number of Addenda: 0

## 2016-07-04 NOTE — Anesthesia Postprocedure Evaluation (Signed)
Anesthesia Post Note  Patient: Amanda Hurley  Procedure(s) Performed: Procedure(s) (LRB): ESOPHAGOGASTRODUODENOSCOPY (EGD) WITH PROPOFOL (N/A) BOTOX INJECTION (N/A)  Patient location during evaluation: Endoscopy Anesthesia Type: MAC Level of consciousness: awake and alert Pain management: pain level controlled Vital Signs Assessment: post-procedure vital signs reviewed and stable Respiratory status: spontaneous breathing, nonlabored ventilation, respiratory function stable and patient connected to nasal cannula oxygen Cardiovascular status: stable and blood pressure returned to baseline Anesthetic complications: no    Last Vitals:  Vitals:   07/04/16 1230 07/04/16 1240  BP: 119/79 (!) 131/91  Pulse: 74   Resp: (!) 22   Temp:      Last Pain:  Vitals:   07/04/16 1213  TempSrc: Oral                 Phillips Groutarignan, Linea Calles

## 2016-07-04 NOTE — Discharge Instructions (Signed)
YOU HAD AN ENDOSCOPIC PROCEDURE TODAY: Refer to the procedure report and other information in the discharge instructions given to you for any specific questions about what was found during the examination. If this information does not answer your questions, please call [facility] office at [phone] to clarify.   YOU SHOULD EXPECT: Some feelings of bloating in the abdomen. Passage of more gas than usual. Walking can help get rid of the air that was put into your GI tract during the procedure and reduce the bloating. If you had a lower endoscopy (such as a colonoscopy or flexible sigmoidoscopy) you may notice spotting of blood in your stool or on the toilet paper. Some abdominal soreness may be present for a day or two, also.  DIET: Your first meal following the procedure should be a light meal and then it is ok to progress to your normal diet. A half-sandwich or bowl of soup is an example of a good first meal. Heavy or fried foods are harder to digest and may make you feel nauseous or bloated. Drink plenty of fluids but you should avoid alcoholic beverages for 24 hours. If you had a esophageal dilation, please see attached instructions for diet.   ACTIVITY: Your care partner should take you home directly after the procedure. You should plan to take it easy, moving slowly for the rest of the day. You can resume normal activity the day after the procedure however YOU SHOULD NOT DRIVE, use power tools, machinery or perform tasks that involve climbing or major physical exertion for 24 hours (because of the sedation medicines used during the test).   SYMPTOMS TO REPORT IMMEDIATELY: A gastroenterologist can be reached at any hour. Please call [phone]  for any of the following symptoms:  Following lower endoscopy (colonoscopy, flexible sigmoidoscopy) Excessive amounts of blood in the stool  Significant tenderness, worsening of abdominal pains  Swelling of the abdomen that is new, acute  Fever of 100 or  higher  Following upper endoscopy (EGD, EUS, ERCP, esophageal dilation) Vomiting of blood or coffee ground material  New, significant abdominal pain  New, significant chest pain or pain under the shoulder blades  Painful or persistently difficult swallowing  New shortness of breath  Black, tarry-looking or red, bloody stools  FOLLOW UP:  If any biopsies were taken you will be contacted by phone or by letter within the next 1-3 weeks. Call [phone]  if you have not heard about the biopsies in 3 weeks.  Please also call with any specific questions about appointments or follow up tests. Call if question or problem otherwise clear liquids for 4 hours and if okay for liquids the rest of today and then slowly advance diet to pured and chew food well and drink plenty of liquids and stay sitting upright for 2-3 hours after eating and follow-up as needed and if successful call in 3-6 months for another injection if it seems to wear off

## 2016-07-04 NOTE — Progress Notes (Signed)
Amanda Hurley 11:41 AM  Subjective: Patient with continuing swallowing problem but no new issues since I seen her in the office and her case discussed with her daughter again today  Objective: Vital signs stable afebrile exam please see preassessment evaluation  Assessment: Esophageal diverticulum  Plan: Okay to proceed with endoscopy and possible Botox today with anesthesia assistance  [facility],Shauna Bodkins E  Pager [phone] After 5PM or if no answer call [phone]

## 2016-07-05 ENCOUNTER — Encounter (HOSPITAL_COMMUNITY): Payer: Self-pay | Admitting: Gastroenterology

## 2016-08-22 ENCOUNTER — Other Ambulatory Visit: Payer: Self-pay | Admitting: Internal Medicine

## 2016-08-22 DIAGNOSIS — Z1231 Encounter for screening mammogram for malignant neoplasm of breast: Secondary | ICD-10-CM

## 2016-09-18 ENCOUNTER — Ambulatory Visit
Admission: RE | Admit: 2016-09-18 | Discharge: 2016-09-18 | Disposition: A | Discharge status: Home / Self Care | Payer: Medicare Other | Source: Ambulatory Visit | Attending: Internal Medicine | Admitting: Internal Medicine

## 2016-09-18 DIAGNOSIS — Z1231 Encounter for screening mammogram for malignant neoplasm of breast: Secondary | ICD-10-CM

## 2016-10-23 ENCOUNTER — Ambulatory Visit
Admission: RE | Admit: 2016-10-23 | Discharge: 2016-10-23 | Disposition: A | Discharge status: Home / Self Care | Payer: Medicare Other | Source: Ambulatory Visit | Attending: Internal Medicine | Admitting: Internal Medicine

## 2016-10-23 ENCOUNTER — Other Ambulatory Visit: Payer: Self-pay | Admitting: Internal Medicine

## 2016-10-23 DIAGNOSIS — R61 Generalized hyperhidrosis: Secondary | ICD-10-CM

## 2016-11-23 ENCOUNTER — Institutional Professional Consult (permissible substitution): Payer: Medicare Other | Admitting: Pulmonary Disease

## 2016-11-28 ENCOUNTER — Institutional Professional Consult (permissible substitution): Payer: Medicare Other | Admitting: Pulmonary Disease

## 2016-12-22 ENCOUNTER — Encounter: Payer: Self-pay | Admitting: Internal Medicine

## 2016-12-22 ENCOUNTER — Ambulatory Visit (INDEPENDENT_AMBULATORY_CARE_PROVIDER_SITE_OTHER): Payer: Medicare Other | Admitting: Internal Medicine

## 2016-12-22 VITALS — BP 110/70 | HR 87 | Ht 67.0 in | Wt 134.0 lb

## 2016-12-22 DIAGNOSIS — R61 Generalized hyperhidrosis: Secondary | ICD-10-CM | POA: Diagnosis not present

## 2016-12-22 DIAGNOSIS — R938 Abnormal findings on diagnostic imaging of other specified body structures: Secondary | ICD-10-CM | POA: Diagnosis not present

## 2016-12-22 DIAGNOSIS — R9389 Abnormal findings on diagnostic imaging of other specified body structures: Secondary | ICD-10-CM

## 2016-12-22 NOTE — Patient Instructions (Signed)
You do not have copd and you never will   Return if cough or short of breath

## 2016-12-22 NOTE — Progress Notes (Signed)
Subjective:     Patient ID: Amanda Hurley, female   DOB: 03/06/1929,     MRN: [id number]  HPI   89 yowf never smoker but has had significant h/o remote passive smoke exp  with new onset night sweats Winter 2018 and abn chest exam > cxr 10/23/16 "copd" per radiology so referred to pulmonary clinic 12/22/2016 by Dr  Johnella MoloneyNeville  Gates     12/22/2016 1st [facility] office visit/ [facility]   Chief Complaint  Patient presents with  . Pulmonary Consult    Referred by Dr. Marden Nobleobert Gates for abnormal cxr. Xray was done to evaluate wt loss and night sweats.   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 50 ft up ? 10 degree inclined  to house but no sob and Not limited by breathing from desired activities though not very active  No obvious day to day or daytime variability or assoc excess/ purulent sputum or mucus plugs or hemoptysis or cp or chest tightness, subjective wheeze or overt sinus or hb symptoms. No unusual exp hx or h/o childhood pna/ asthma or knowledge of premature birth.  Sleeping ok without nocturnal  or early am exacerbation  of respiratory  c/o's or need for noct saba. Also denies any obvious fluctuation of symptoms with weather or environmental changes or other aggravating or alleviating factors except as outlined above   Current Medications, Allergies, Complete Past Medical History, Past Surgical History, Family History, and Social History were reviewed [facility].  ROS  The following are not active complaints unless bolded sore throat, dysphagia with zenker's/ f/u [name] , dental problems, itching, sneezing,  nasal congestion or excess/ purulent secretions, ear ache,   fever, chills, nightsweats, unintended wt loss, classically pleuritic or exertional cp,  orthopnea pnd or leg swelling, presyncope, palpitations, abdominal pain, anorexia, nausea, vomiting, diarrhea  or change in bowel or bladder  habits, change in stools or urine, dysuria,hematuria,  rash, arthralgias, visual complaints, headache, numbness, weakness or ataxia or problems with walking or coordination,  change in mood/affect or memory.              Review of Systems     Objective:   Physical Exam   Stoic amb wf nad  Wt Readings from Last 3 Encounters:  12/22/16 134 lb (60.8 kg)  07/04/16 135 lb (61.2 kg)  10/21/14 146 lb 1.9 oz (66.3 kg)    Vital signs reviewed  - Note on arrival 02 sats  98% on RA      HEENT: nl dentition, turbinates bilaterally, and oropharynx. Nl external ear canals without cough reflex   NECK :  without JVD/Nodes/TM/ nl carotid upstrokes bilaterally   LUNGS: no acc muscle use,  Nl contour chest wiith minimal insp crackles R > L base s assoc cough    CV:  RRR  no s3 or murmur or increase in P2, and no edema   ABD:  soft and nontender with nl inspiratory excursion in the supine position. No bruits or organomegaly appreciated, bowel sounds nl  MS:  Nl gait/ ext warm without deformities, calf tenderness, cyanosis or clubbing No obvious joint restrictions   SKIN: warm and dry without lesions    NEURO:  alert, approp, nl sensorium with  no motor or cerebellar deficits apparent.       I personally reviewed images and agree with radiology impression as follows:  CXR: 10/23/16 Mild hyperinflation consistent with COPD or reactive airway disease. Mildly increase conspicuity of the pulmonary interstitial markings overall may reflect acute or chronic processes including pneumonitis.          Assessment:

## 2016-12-23 DIAGNOSIS — R61 Generalized hyperhidrosis: Secondary | ICD-10-CM | POA: Insufficient documentation

## 2016-12-23 NOTE — Assessment & Plan Note (Addendum)
Spirometry 12/22/2016  FEV1 1.65 (81%)  Ratio 93 (did not blow out six seconds but no curvature to f/v loop to support any airflow obst at all.   Fm concerned about her past heavy indirect cig smoke exposure but she has no limited sob or cough so even if she did have copd as per radiology's impression there would be no impact on her morbidity/ mortality and  likely no need for pulmonary meds as there are plenty of pts with GOLD 1 and II copd who don't benefit from or need rx and at age 83 unlikely to ever develop it physiologically nor any way to prevent the normal effects of aging on the lung with copd meds anyway, even in former (direct)  smokers.    However, she does have a few crackles in bases with slt increased marking and in setting of dysphagia I would be concerned about recurrent aspiration related to dysphagia/ zenker's and note has f/u with Dr Ewing SchleinMagod.  If she does develop any kind of chronic cough would pursue GI before Pulmonary interventions as these would be much more likely to benefit her. '  Total time devoted to counseling  > 50 % of initial 60 min office visit:  review case with pt/son/daughter discussion of options/alternatives/ personally creating written customized instructions  in presence of pt  then going over those specific  Instructions directly with the pt including how to use all of the meds but in particular covering each new medication in detail and the difference between the maintenance= "automatic" meds and the prns using an action plan format for the latter (If this problem/symptom => do that organization reading Left to right).  Please see AVS from this visit for a full list of these instructions which I personally wrote for this pt and  are unique to this visit.

## 2016-12-23 NOTE — Assessment & Plan Note (Signed)
No assoc wt loss and problem is intermittent, not qhs s chills or obvious pulmonary source though is at risk for noct aspiration with known zenker's so this should be kept in mind if does develop assoc pna syndrome in future.   No further pulmonary w/u needed at this point.

## 2017-09-28 ENCOUNTER — Other Ambulatory Visit: Payer: Self-pay | Admitting: Internal Medicine

## 2017-09-28 DIAGNOSIS — Z139 Encounter for screening, unspecified: Secondary | ICD-10-CM

## 2017-10-17 ENCOUNTER — Ambulatory Visit: Payer: Medicare Other

## 2017-10-29 ENCOUNTER — Ambulatory Visit
Admission: RE | Admit: 2017-10-29 | Discharge: 2017-10-29 | Disposition: A | Discharge status: Home / Self Care | Payer: Medicare Other | Source: Ambulatory Visit | Attending: Internal Medicine | Admitting: Internal Medicine

## 2017-10-29 DIAGNOSIS — Z139 Encounter for screening, unspecified: Secondary | ICD-10-CM

## 2018-02-04 ENCOUNTER — Other Ambulatory Visit: Payer: Self-pay

## 2018-02-04 ENCOUNTER — Emergency Department (HOSPITAL_COMMUNITY): Payer: Medicare Other

## 2018-02-04 ENCOUNTER — Emergency Department (HOSPITAL_COMMUNITY)
Admission: EM | Admit: 2018-02-04 | Discharge: 2018-02-04 | Disposition: A | Discharge status: Home / Self Care | Payer: Medicare Other | Attending: Emergency Medicine | Admitting: Emergency Medicine

## 2018-02-04 ENCOUNTER — Encounter (HOSPITAL_COMMUNITY): Payer: Self-pay

## 2018-02-04 DIAGNOSIS — I10 Essential (primary) hypertension: Secondary | ICD-10-CM | POA: Diagnosis not present

## 2018-02-04 DIAGNOSIS — N390 Urinary tract infection, site not specified: Secondary | ICD-10-CM | POA: Diagnosis not present

## 2018-02-04 DIAGNOSIS — Y999 Unspecified external cause status: Secondary | ICD-10-CM | POA: Diagnosis not present

## 2018-02-04 DIAGNOSIS — F0390 Unspecified dementia without behavioral disturbance: Secondary | ICD-10-CM | POA: Diagnosis not present

## 2018-02-04 DIAGNOSIS — S0083XA Contusion of other part of head, initial encounter: Secondary | ICD-10-CM | POA: Diagnosis not present

## 2018-02-04 DIAGNOSIS — S0990XA Unspecified injury of head, initial encounter: Secondary | ICD-10-CM | POA: Diagnosis present

## 2018-02-04 DIAGNOSIS — Y9301 Activity, walking, marching and hiking: Secondary | ICD-10-CM | POA: Diagnosis not present

## 2018-02-04 DIAGNOSIS — W19XXXA Unspecified fall, initial encounter: Secondary | ICD-10-CM | POA: Diagnosis not present

## 2018-02-04 DIAGNOSIS — Y929 Unspecified place or not applicable: Secondary | ICD-10-CM | POA: Diagnosis not present

## 2018-02-04 DIAGNOSIS — Z79899 Other long term (current) drug therapy: Secondary | ICD-10-CM | POA: Diagnosis not present

## 2018-02-04 LAB — CBC WITH DIFFERENTIAL/PLATELET
ABS IMMATURE GRANULOCYTES: 0 10*3/uL (ref 0.0–0.1)
BASOS ABS: 0.1 10*3/uL (ref 0.0–0.1)
Basophils Relative: 0 %
EOS PCT: 1 %
Eosinophils Absolute: 0.1 10*3/uL (ref 0.0–0.7)
HEMATOCRIT: 41.9 % (ref 36.0–46.0)
HEMOGLOBIN: 13.2 g/dL (ref 12.0–15.0)
Immature Granulocytes: 0 %
LYMPHS ABS: 3.1 10*3/uL (ref 0.7–4.0)
LYMPHS PCT: 24 %
MCH: 28.9 pg (ref 26.0–34.0)
MCHC: 31.5 g/dL (ref 30.0–36.0)
MCV: 91.9 fL (ref 78.0–100.0)
MONO ABS: 1.3 10*3/uL — AB (ref 0.1–1.0)
Monocytes Relative: 10 %
NEUTROS ABS: 8.6 10*3/uL — AB (ref 1.7–7.7)
Neutrophils Relative %: 65 %
PLATELETS: 163 10*3/uL (ref 150–400)
RBC: 4.56 MIL/uL (ref 3.87–5.11)
RDW: 12.8 % (ref 11.5–15.5)
WBC: 13.2 10*3/uL — ABNORMAL HIGH (ref 4.0–10.5)

## 2018-02-04 LAB — COMPREHENSIVE METABOLIC PANEL
ALK PHOS: 61 U/L (ref 38–126)
ALT: 15 U/L (ref 0–44)
AST: 25 U/L (ref 15–41)
Albumin: 3.3 g/dL — ABNORMAL LOW (ref 3.5–5.0)
Anion gap: 6 (ref 5–15)
BUN: 12 mg/dL (ref 8–23)
CALCIUM: 9.8 mg/dL (ref 8.9–10.3)
CHLORIDE: 107 mmol/L (ref 98–111)
CO2: 28 mmol/L (ref 22–32)
CREATININE: 0.92 mg/dL (ref 0.44–1.00)
GFR calc Af Amer: >60 mL/min (ref >60)
GFR, EST NON AFRICAN AMERICAN: 55 mL/min — AB (ref >60)
Glucose, Bld: 107 mg/dL — ABNORMAL HIGH (ref 70–99)
Potassium: 4 mmol/L (ref 3.5–5.1)
Sodium: 141 mmol/L (ref 135–145)
Total Bilirubin: 1.6 mg/dL — ABNORMAL HIGH (ref 0.3–1.2)
Total Protein: 7.1 g/dL (ref 6.5–8.1)

## 2018-02-04 LAB — URINALYSIS, ROUTINE W REFLEX MICROSCOPIC
Bilirubin Urine: NEGATIVE
Glucose, UA: NEGATIVE mg/dL
Hgb urine dipstick: NEGATIVE
Ketones, ur: NEGATIVE mg/dL
Nitrite: NEGATIVE
PH: 7 (ref 5.0–8.0)
Protein, ur: NEGATIVE mg/dL
SPECIFIC GRAVITY, URINE: 1.008 (ref 1.005–1.030)

## 2018-02-04 LAB — URINALYSIS, DIPSTICK ONLY
Bilirubin Urine: NEGATIVE
GLUCOSE, UA: NEGATIVE mg/dL
Ketones, ur: NEGATIVE mg/dL
Nitrite: NEGATIVE
PH: 7 (ref 5.0–8.0)
PROTEIN: NEGATIVE mg/dL

## 2018-02-04 NOTE — ED Triage Notes (Signed)
Pt brought in by family due to having a fall outside this morning. Pt has dementia and has had abnormal behaviors such as walking outside during the night. Pt has abrasion and bruising on face and left shoulder. Pt prescribed new meds per family. Pt has been seeing and hearing things.

## 2018-02-04 NOTE — ED Notes (Signed)
Patient transported to CT 

## 2018-02-04 NOTE — Discharge Instructions (Addendum)
See Dr. Kevan NyGates for recheck in 1 week.  Stop Olanzapine.  Start cipro as previously prescribed.

## 2018-02-04 NOTE — ED Provider Notes (Signed)
[facility] EMERGENCY DEPARTMENT Provider Note   CSN: [id number] Arrival date & time: 02/04/18  4098     History   Chief Complaint Chief Complaint  Patient presents with  . Fall    HPI Amanda Hurley is a 84 y.o. female.  The history is provided by the patient. A language interpreter was used.  Fall  This is a new problem. The current episode started 3 to 5 hours ago. The problem has not changed since onset.Pertinent negatives include no abdominal pain and no headaches. Nothing aggravates the symptoms. Nothing relieves the symptoms. She has tried nothing for the symptoms. The treatment provided no relief.  Pt has had recent increase in confusion.  Pt lives near her son.  Family reports pt saw Dr. Kevan Ny recently and was started on 1mg  of zyprexa due to confusion and hallucinations.   Family reports pt was outside of son's home today.  Pt has abrasions on her face. Pt does not recall falling.  No one witnessed fall.   Past Medical History:  Diagnosis Date  . AAA (abdominal aortic aneurysm) (HCC)   . Hyperlipidemia   . Hypertension     Patient Active Problem List   Diagnosis Date Noted  . Unexplained night sweats 12/23/2016  . Abnormal CXR 12/22/2016    Past Surgical History:  Procedure Laterality Date  . ABDOMINAL HYSTERECTOMY    . BOTOX INJECTION N/A 07/04/2016   Procedure: BOTOX INJECTION;  Surgeon: Vida Rigger, MD;  Location: [facility];  Service: Endoscopy;  Laterality: N/A;  . ESOPHAGOGASTRODUODENOSCOPY (EGD) WITH PROPOFOL N/A 07/04/2016   Procedure: ESOPHAGOGASTRODUODENOSCOPY (EGD) WITH PROPOFOL;  Surgeon: Vida Rigger, MD;  Location: [facility];  Service: Endoscopy;  Laterality: N/A;     OB History   None      Home Medications    Prior to Admission medications   Medication Sig Start Date End Date Taking? Authorizing Provider  Calcium Carbonate-Vitamin D (CALTRATE 600+D PO) Take 1 tablet by mouth daily.   Yes [provider]    Cranberry 450 MG CAPS Take 450 mg by mouth daily.   Yes [provider]  OLANZapine (ZYPREXA) 2.5 MG tablet Take 2.5 mg by mouth at bedtime.   Yes [provider]  Propylene Glycol (SYSTANE COMPLETE) 0.6 % SOLN Apply 1 drop to eye as needed (dry eyes).   Yes [provider]    Family History Family History  Problem Relation Age of Onset  . Hypertension Mother   . CAD Father   . Dementia Brother   . Cancer - Other Brother   . Leukemia Brother   . Breast cancer Neg Hx     Social History Social History   Tobacco Use  . Smoking status: Never Smoker  . Smokeless tobacco: Never Used  Substance Use Topics  . Alcohol use: No    Alcohol/week: 0.0 oz  . Drug use: No     Allergies   Patient has no known allergies.   Review of Systems Review of Systems  Respiratory: Negative for cough and chest tightness.   Gastrointestinal: Negative for abdominal pain.  Neurological: Negative for dizziness and headaches.  Psychiatric/Behavioral: Positive for confusion.  All other systems reviewed and are negative.    Physical Exam Updated Vital Signs BP 122/86   Pulse 78   Temp 97.9 F (36.6 C) (Oral)   Resp 16   Ht 5\' 7"  (1.702 m)   Wt 59 kg (130 lb)   SpO2 99%  BMI 20.36 kg/m   Physical Exam  Constitutional: She appears well-developed and well-nourished.  HENT:  Head: Normocephalic.  Right Ear: External ear normal.  Left Ear: External ear normal.  Mouth/Throat: Oropharynx is clear and moist.  Abrasions face and forehead  Eyes: Pupils are equal, round, and reactive to light.  Neck:  c spine diffusely tender,  Pain with movement  From uper and lower extremities   Cardiovascular: Normal rate and regular rhythm.  Pulmonary/Chest: Effort normal and breath sounds normal.  Abdominal: Soft.  Musculoskeletal: Normal range of motion.  Neurological: She is alert.  Skin: Skin is warm.  Psychiatric: She has a normal mood and affect.  Nursing note and  vitals reviewed.    ED Treatments / Results  Labs (all labs ordered are listed, but only abnormal results are displayed) Labs Reviewed  URINALYSIS, DIPSTICK ONLY - Abnormal; Notable for the following components:      Result Value   Specific Gravity, Urine <1.005 (*)    Hgb urine dipstick TRACE (*)    Leukocytes, UA LARGE (*)    All other components within normal limits  CBC WITH DIFFERENTIAL/PLATELET - Abnormal; Notable for the following components:   WBC 13.2 (*)    Neutro Abs 8.6 (*)    Monocytes Absolute 1.3 (*)    All other components within normal limits  COMPREHENSIVE METABOLIC PANEL - Abnormal; Notable for the following components:   Glucose, Bld 107 (*)    Albumin 3.3 (*)    Total Bilirubin 1.6 (*)    GFR calc non Af Amer 55 (*)    All other components within normal limits  URINALYSIS, ROUTINE W REFLEX MICROSCOPIC    EKG None  Radiology Ct Head Wo Contrast  Result Date: 02/04/2018 CLINICAL DATA:  Fall today. EXAM: CT HEAD WITHOUT CONTRAST CT MAXILLOFACIAL WITHOUT CONTRAST CT CERVICAL SPINE WITHOUT CONTRAST TECHNIQUE: Multidetector CT imaging of the head, cervical spine, and maxillofacial structures were performed using the standard protocol without intravenous contrast. Multiplanar CT image reconstructions of the cervical spine and maxillofacial structures were also generated. COMPARISON:  CT scan of October 16, 2015. FINDINGS: CT HEAD FINDINGS Brain: Mild diffuse cortical atrophy is noted. Mild chronic ischemic white matter disease is noted. No mass effect or midline shift is noted. Ventricular size is within normal limits. There is no evidence of mass lesion, hemorrhage or acute infarction. Vascular: No hyperdense vessel or unexpected calcification. Skull: Normal. Negative for fracture or focal lesion. Other: Small left frontal scalp hematoma is noted. CT MAXILLOFACIAL FINDINGS Osseous: No fracture or mandibular dislocation. No destructive process. Orbits: Negative. No  traumatic or inflammatory finding. Sinuses: Clear. Soft tissues: Negative. CT CERVICAL SPINE FINDINGS Alignment: Normal. Skull base and vertebrae: No acute fracture. No primary bone lesion or focal pathologic process. Soft tissues and spinal canal: No prevertebral fluid or swelling. No visible canal hematoma. Disc levels: Mild degenerative disc disease is noted at C5-6 and C6-7 with anterior osteophyte formation. Upper chest: Negative. Other: Degenerative changes are seen involving the posterior facet joints bilaterally. IMPRESSION: Mild diffuse cortical atrophy. Mild chronic ischemic white matter disease. Small left frontal scalp hematoma. No acute intracranial abnormality seen. No significant abnormality seen in maxillofacial region. Mild multilevel degenerative disc disease. No acute abnormality seen in the cervical spine. Electronically Signed   By: Lupita Raider, M.D.   On: 02/04/2018 10:31   Ct Cervical Spine Wo Contrast  Result Date: 02/04/2018 CLINICAL DATA:  Fall today. EXAM: CT HEAD WITHOUT CONTRAST CT MAXILLOFACIAL WITHOUT  CONTRAST CT CERVICAL SPINE WITHOUT CONTRAST TECHNIQUE: Multidetector CT imaging of the head, cervical spine, and maxillofacial structures were performed using the standard protocol without intravenous contrast. Multiplanar CT image reconstructions of the cervical spine and maxillofacial structures were also generated. COMPARISON:  CT scan of October 16, 2015. FINDINGS: CT HEAD FINDINGS Brain: Mild diffuse cortical atrophy is noted. Mild chronic ischemic white matter disease is noted. No mass effect or midline shift is noted. Ventricular size is within normal limits. There is no evidence of mass lesion, hemorrhage or acute infarction. Vascular: No hyperdense vessel or unexpected calcification. Skull: Normal. Negative for fracture or focal lesion. Other: Small left frontal scalp hematoma is noted. CT MAXILLOFACIAL FINDINGS Osseous: No fracture or mandibular dislocation. No destructive  process. Orbits: Negative. No traumatic or inflammatory finding. Sinuses: Clear. Soft tissues: Negative. CT CERVICAL SPINE FINDINGS Alignment: Normal. Skull base and vertebrae: No acute fracture. No primary bone lesion or focal pathologic process. Soft tissues and spinal canal: No prevertebral fluid or swelling. No visible canal hematoma. Disc levels: Mild degenerative disc disease is noted at C5-6 and C6-7 with anterior osteophyte formation. Upper chest: Negative. Other: Degenerative changes are seen involving the posterior facet joints bilaterally. IMPRESSION: Mild diffuse cortical atrophy. Mild chronic ischemic white matter disease. Small left frontal scalp hematoma. No acute intracranial abnormality seen. No significant abnormality seen in maxillofacial region. Mild multilevel degenerative disc disease. No acute abnormality seen in the cervical spine. Electronically Signed   By: Lupita Raider, M.D.   On: 02/04/2018 10:31   Ct Maxillofacial Wo Contrast  Result Date: 02/04/2018 CLINICAL DATA:  Fall today. EXAM: CT HEAD WITHOUT CONTRAST CT MAXILLOFACIAL WITHOUT CONTRAST CT CERVICAL SPINE WITHOUT CONTRAST TECHNIQUE: Multidetector CT imaging of the head, cervical spine, and maxillofacial structures were performed using the standard protocol without intravenous contrast. Multiplanar CT image reconstructions of the cervical spine and maxillofacial structures were also generated. COMPARISON:  CT scan of October 16, 2015. FINDINGS: CT HEAD FINDINGS Brain: Mild diffuse cortical atrophy is noted. Mild chronic ischemic white matter disease is noted. No mass effect or midline shift is noted. Ventricular size is within normal limits. There is no evidence of mass lesion, hemorrhage or acute infarction. Vascular: No hyperdense vessel or unexpected calcification. Skull: Normal. Negative for fracture or focal lesion. Other: Small left frontal scalp hematoma is noted. CT MAXILLOFACIAL FINDINGS Osseous: No fracture or mandibular  dislocation. No destructive process. Orbits: Negative. No traumatic or inflammatory finding. Sinuses: Clear. Soft tissues: Negative. CT CERVICAL SPINE FINDINGS Alignment: Normal. Skull base and vertebrae: No acute fracture. No primary bone lesion or focal pathologic process. Soft tissues and spinal canal: No prevertebral fluid or swelling. No visible canal hematoma. Disc levels: Mild degenerative disc disease is noted at C5-6 and C6-7 with anterior osteophyte formation. Upper chest: Negative. Other: Degenerative changes are seen involving the posterior facet joints bilaterally. IMPRESSION: Mild diffuse cortical atrophy. Mild chronic ischemic white matter disease. Small left frontal scalp hematoma. No acute intracranial abnormality seen. No significant abnormality seen in maxillofacial region. Mild multilevel degenerative disc disease. No acute abnormality seen in the cervical spine. Electronically Signed   By: Lupita Raider, M.D.   On: 02/04/2018 10:31    Procedures Procedures (including critical care time)  Medications Ordered in ED Medications - No data to display   Initial Impression / Assessment and Plan / ED Course  I have reviewed the triage vital signs and the nursing notes.  Pertinent labs & imaging results that were available during  my care of the patient were reviewed by me and considered in my medical decision making (see chart for details).    MDM   Pt able to ambulate.  Multiple abrasions cleaned and antibiotic ointment applied.  Ct head, Maxillofacial and neck show no evidence of fracture.  Labs reviewed.  Urine is positive for leukocytes.  Pt has a filled Rx for cipro.  I advised to take as directed.  Discontinue zyrprexa.   Family is planning to provide pt with more supervision due to confusion.    Final Clinical Impressions(s) / ED Diagnoses   Final diagnoses:  Fall, initial encounter  Contusion of face, initial encounter  Urinary tract infection without hematuria, site  unspecified    ED Discharge Orders    None    Cipro 250 #14 one po bid  An After Visit Summary was printed and given to the patient.    Osie CheeksSofia, Leslie K, PA-C 02/04/18 1440    Azalia Bilisampos, Kevin, MD 02/05/18 [phone]

## 2018-02-05 LAB — URINE CULTURE

## 2018-03-11 ENCOUNTER — Other Ambulatory Visit: Payer: Self-pay | Admitting: Gastroenterology

## 2018-03-25 ENCOUNTER — Other Ambulatory Visit: Payer: Self-pay | Admitting: Gastroenterology

## 2018-03-26 ENCOUNTER — Ambulatory Visit (HOSPITAL_COMMUNITY)
Admission: RE | Admit: 2018-03-26 | Discharge: 2018-03-26 | Disposition: A | Discharge status: Home / Self Care | Payer: Medicare Other | Source: Ambulatory Visit | Attending: Gastroenterology | Admitting: Gastroenterology

## 2018-03-26 ENCOUNTER — Ambulatory Visit (HOSPITAL_COMMUNITY): Payer: Medicare Other | Admitting: Certified Registered Nurse Anesthetist

## 2018-03-26 ENCOUNTER — Encounter (HOSPITAL_COMMUNITY): Payer: Self-pay

## 2018-03-26 ENCOUNTER — Encounter (HOSPITAL_COMMUNITY)
Admission: RE | Disposition: A | Discharge status: Home / Self Care | Payer: Self-pay | Source: Ambulatory Visit | Attending: Gastroenterology

## 2018-03-26 ENCOUNTER — Other Ambulatory Visit: Payer: Self-pay

## 2018-03-26 DIAGNOSIS — K449 Diaphragmatic hernia without obstruction or gangrene: Secondary | ICD-10-CM | POA: Diagnosis not present

## 2018-03-26 DIAGNOSIS — K220 Achalasia of cardia: Secondary | ICD-10-CM | POA: Insufficient documentation

## 2018-03-26 DIAGNOSIS — T18128A Food in esophagus causing other injury, initial encounter: Secondary | ICD-10-CM | POA: Insufficient documentation

## 2018-03-26 DIAGNOSIS — X58XXXA Exposure to other specified factors, initial encounter: Secondary | ICD-10-CM | POA: Insufficient documentation

## 2018-03-26 DIAGNOSIS — I739 Peripheral vascular disease, unspecified: Secondary | ICD-10-CM | POA: Insufficient documentation

## 2018-03-26 DIAGNOSIS — K225 Diverticulum of esophagus, acquired: Secondary | ICD-10-CM | POA: Insufficient documentation

## 2018-03-26 DIAGNOSIS — I10 Essential (primary) hypertension: Secondary | ICD-10-CM | POA: Diagnosis not present

## 2018-03-26 HISTORY — PX: ESOPHAGOGASTRODUODENOSCOPY (EGD) WITH PROPOFOL: SHX5813

## 2018-03-26 HISTORY — PX: FOREIGN BODY REMOVAL: SHX962

## 2018-03-26 HISTORY — PX: BOTOX INJECTION: SHX5754

## 2018-03-26 SURGERY — ESOPHAGOGASTRODUODENOSCOPY (EGD) WITH PROPOFOL
Anesthesia: Monitor Anesthesia Care

## 2018-03-26 MED ORDER — SODIUM CHLORIDE 0.9 % IV SOLN: INTRAVENOUS | Status: DC

## 2018-03-26 MED ORDER — SODIUM CHLORIDE 0.9 % IJ SOLN
INTRAMUSCULAR | Status: AC
  Filled 2018-03-26: qty 10

## 2018-03-26 MED ORDER — PROPOFOL 10 MG/ML IV BOLUS
INTRAVENOUS | Status: DC | PRN
  Administered 2018-03-26: 5 mg via INTRAVENOUS

## 2018-03-26 MED ORDER — PROPOFOL 10 MG/ML IV BOLUS
INTRAVENOUS | Status: AC
  Filled 2018-03-26: qty 40

## 2018-03-26 MED ORDER — ONDANSETRON HCL 4 MG/2ML IJ SOLN
INTRAMUSCULAR | Status: DC | PRN
  Administered 2018-03-26: 4 mg via INTRAVENOUS

## 2018-03-26 MED ORDER — LACTATED RINGERS IV SOLN
INTRAVENOUS | Status: DC
  Administered 2018-03-26: 1000 mL via INTRAVENOUS

## 2018-03-26 MED ORDER — PROPOFOL 500 MG/50ML IV EMUL
INTRAVENOUS | Status: DC | PRN
  Administered 2018-03-26: 100 ug/kg/min via INTRAVENOUS

## 2018-03-26 MED ORDER — SODIUM CHLORIDE 0.9 % IJ SOLN
INTRAMUSCULAR | Status: DC | PRN
  Administered 2018-03-26: 4 mL via SUBMUCOSAL

## 2018-03-26 MED ORDER — ONABOTULINUMTOXINA 100 UNITS IJ SOLR
INTRAMUSCULAR | Status: AC
  Filled 2018-03-26: qty 100

## 2018-03-26 MED ORDER — LIDOCAINE 2% (20 MG/ML) 5 ML SYRINGE
INTRAMUSCULAR | Status: DC | PRN
  Administered 2018-03-26: 50 mg via INTRAVENOUS

## 2018-03-26 SURGICAL SUPPLY — 15 items

## 2018-03-26 NOTE — Anesthesia Preprocedure Evaluation (Signed)
Anesthesia Evaluation  Patient identified by MRN, date of birth, ID band Patient awake    Reviewed: Allergy & Precautions, NPO status , Patient's Chart, lab work & pertinent test results  Airway Mallampati: II  TM Distance: >3 FB Neck ROM: Full    Dental no notable dental hx.    Pulmonary neg pulmonary ROS,    Pulmonary exam normal breath sounds clear to auscultation       Cardiovascular hypertension, + Peripheral Vascular Disease  Normal cardiovascular exam Rhythm:Regular Rate:Normal     Neuro/Psych negative neurological ROS  negative psych ROS   GI/Hepatic negative GI ROS, Neg liver ROS,   Endo/Other  negative endocrine ROS  Renal/GU negative Renal ROS  negative genitourinary   Musculoskeletal negative musculoskeletal ROS (+)   Abdominal   Peds negative pediatric ROS (+)  Hematology negative hematology ROS (+)   Anesthesia Other Findings   Reproductive/Obstetrics negative OB ROS                             Anesthesia Physical Anesthesia Plan  ASA: III  Anesthesia Plan: MAC   Post-op Pain Management:    Induction: Intravenous  PONV Risk Score and Plan: 0  Airway Management Planned: Simple Face Mask  Additional Equipment:   Intra-op Plan:   Post-operative Plan:   Informed Consent: I have reviewed the patients History and Physical, chart, labs and discussed the procedure including the risks, benefits and alternatives for the proposed anesthesia with the patient or authorized representative who has indicated his/her understanding and acceptance.   Dental advisory given  Plan Discussed with: CRNA and Surgeon  Anesthesia Plan Comments:         Anesthesia Quick Evaluation

## 2018-03-26 NOTE — Discharge Instructions (Signed)
Call if question or problem or if swallowing problems no better otherwise clear liquid diet for 4 hours and may have either soft solids or pured food if doing well and stay upright for 2-3 hours after eating and chew food well and take small bites and drink plenty of liquids while eating and no wheeze drink one more glass of water when finished

## 2018-03-26 NOTE — Transfer of Care (Signed)
Immediate Anesthesia Transfer of Care Note  Patient: Amanda Hurley  Procedure(s) Performed: ESOPHAGOGASTRODUODENOSCOPY (EGD) WITH PROPOFOL (N/A ) BOTOX INJECTION (N/A )  Patient Location: PACU  Anesthesia Type:MAC  Level of Consciousness: awake and alert   Airway & Oxygen Therapy: Patient Spontanous Breathing and Patient connected to nasal cannula oxygen  Post-op Assessment: Report given to RN and Post -op Vital signs reviewed and stable  Post vital signs: Reviewed and stable  Last Vitals:  Vitals Value Taken Time  BP    Temp    Pulse 77 03/26/2018  1:42 PM  Resp 19 03/26/2018  1:42 PM  SpO2 100 % 03/26/2018  1:42 PM  Vitals shown include unvalidated device data.  Last Pain:  Vitals:   03/26/18 1221  TempSrc: Oral  PainSc: 0-No pain         Complications: No apparent anesthesia complications

## 2018-03-26 NOTE — Op Note (Signed)
[facility] Patient Name: Amanda Hurley Procedure Date: 03/26/2018 MRN: [id number] Attending MD: Vida Rigger , MD Date of Birth: 22-Apr-1929 CSN: [id number] Age: 84 Admit Type: Outpatient Procedure:                Upper GI endoscopy Indications:              Dysphagia, history of large distal diverticulum and                            response to Botox injection in the past Providers:                Vida Rigger, MD, Janae Sauce. Steele Berg, RN, Zoila Shutter,                            Technician, Maricela Curet, CRNA Referring MD:              Medicines:                Propofol total dose 125 mg IV, Ondansetron 4 mg IV,                            50 mg IV lidocaine Complications:            No immediate complications. Estimated Blood Loss:     Estimated blood loss: none. Procedure:                Pre-Anesthesia Assessment:                           - Prior to the procedure, a History and Physical                            was performed, and patient medications and                            allergies were reviewed. The patient's tolerance of                            previous anesthesia was also reviewed. The risks                            and benefits of the procedure and the sedation                            options and risks were discussed with the patient.                            All questions were answered, and informed consent                            was obtained. Prior Anticoagulants: The patient has                            taken no previous anticoagulant or antiplatelet  agents. ASA Grade Assessment: III - A patient with                            severe systemic disease. After reviewing the risks                            and benefits, the patient was deemed in                            satisfactory condition to undergo the procedure.                           After obtaining informed consent, the endoscope was        passed under direct vision. Throughout the                            procedure, the patient's blood pressure, pulse, and                            oxygen saturations were monitored continuously. The                            GIF-H190 ([id number] Olympus adult endoscope was                            introduced through the mouth, and advanced to the                            second part of duodenum. The upper GI endoscopy was                            somewhat difficult due to abnormal anatomy. The                            patient tolerated the procedure well. Scope In: Scope Out: Findings:      A hypertonic lower esophageal sphincter was found at the edge of the       diverticulum. There was mild resistance to endoscope advancement into       the stomach. The Z-line was regular. The gastroesophageal junction and       cardia were normal on retroflexed view. Area was successfully injected       with 100 units botulinum toxin in the customary fashion with 4       injections of 25 units each into the 4 quadrants.      A small hiatal hernia was present.      A non-bleeding diverticulum with a large opening and no stigmata of       recent bleeding was found in the lower third of the esophagus. Lavage of       the area was performed using a moderate amount of sterile water,       resulting in clearance with good visualization.      Food and probable pill particles were found in the lower third of the       esophagus. Removal was accomplished with a Lucina Mellow  net x 2.      The entire examined stomach was normal.      The duodenal bulb, first portion of the duodenum and second portion of       the duodenum were normal.      The exam was otherwise without abnormality. Impression:               - The esophageal examination was consistent with                            achalasia. Injected with botulinum toxin.                           - Small hiatal hernia.                           -  Diverticulum in the lower third of the esophagus.                           - Food was found in the esophagus. Removal was                            successful.                           - Normal stomach.                           - Normal duodenal bulb, first portion of the                            duodenum and second portion of the duodenum.                           - The examination was otherwise normal. Moderate Sedation:      N/A- Per Anesthesia Care Recommendation:           - Patient has a contact number available for                            emergencies. The signs and symptoms of potential                            delayed complications were discussed with the                            patient. Return to normal activities tomorrow.                            Written discharge instructions were provided to the                            patient.                           - Clear liquid diet for 4 hours. If doing well may  have soft solids later today                           - Continue present medications.                           - Return to GI clinic in 4 weeks.                           - Telephone GI clinic if symptomatic PRN. Procedure Code(s):        --- Professional ---                           [phone], Esophagogastroduodenoscopy, flexible,                            transoral; with removal of foreign body(s)                           43236, 59, Esophagogastroduodenoscopy, flexible,                            transoral; with directed submucosal injection(s),                            any substance Diagnosis Code(s):        --- Professional ---                           K44.9, Diaphragmatic hernia without obstruction or                            gangrene                           Q39.6, Congenital diverticulum of esophagus                           T18.128A, Food in esophagus causing other injury,                            initial  encounter                           R13.10, Dysphagia, unspecified CPT copyright 2017 [employer]. All rights reserved. The codes documented in this report are preliminary and upon coder review may  be revised to meet current compliance requirements. Vida Rigger, MD 03/26/2018 1:46:46 PM This report has been signed electronically. Number of Addenda: 0

## 2018-03-26 NOTE — Anesthesia Postprocedure Evaluation (Signed)
Anesthesia Post Note  Patient: JADIRA NIERMAN  Procedure(s) Performed: ESOPHAGOGASTRODUODENOSCOPY (EGD) WITH PROPOFOL (N/A ) BOTOX INJECTION (N/A )     Patient location during evaluation: PACU Anesthesia Type: MAC Level of consciousness: awake and alert Pain management: pain level controlled Vital Signs Assessment: post-procedure vital signs reviewed and stable Respiratory status: spontaneous breathing, nonlabored ventilation, respiratory function stable and patient connected to nasal cannula oxygen Cardiovascular status: stable and blood pressure returned to baseline Postop Assessment: no apparent nausea or vomiting Anesthetic complications: no    Last Vitals:  Vitals:   03/26/18 1405 03/26/18 1408  BP:  118/88  Pulse: 74 70  Resp: 18 (!) 22  Temp:    SpO2: 100% 100%    Last Pain:  Vitals:   03/26/18 1405  TempSrc:   PainSc: 0-No pain                 Oneisha Ammons S

## 2018-03-26 NOTE — Progress Notes (Signed)
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

## 2018-03-27 ENCOUNTER — Encounter (HOSPITAL_COMMUNITY): Payer: Self-pay | Admitting: Gastroenterology

## 2018-05-20 ENCOUNTER — Ambulatory Visit: Payer: Medicare Other | Admitting: Diagnostic Neuroimaging

## 2018-08-25 ENCOUNTER — Other Ambulatory Visit: Payer: Self-pay

## 2018-08-25 ENCOUNTER — Encounter (HOSPITAL_BASED_OUTPATIENT_CLINIC_OR_DEPARTMENT_OTHER): Payer: Self-pay | Admitting: Emergency Medicine

## 2018-08-25 ENCOUNTER — Emergency Department (HOSPITAL_BASED_OUTPATIENT_CLINIC_OR_DEPARTMENT_OTHER): Payer: Medicare Other

## 2018-08-25 ENCOUNTER — Emergency Department (HOSPITAL_BASED_OUTPATIENT_CLINIC_OR_DEPARTMENT_OTHER)
Admission: EM | Admit: 2018-08-25 | Discharge: 2018-08-25 | Disposition: A | Discharge status: Home / Self Care | Payer: Medicare Other | Attending: Emergency Medicine | Admitting: Emergency Medicine

## 2018-08-25 DIAGNOSIS — S0990XA Unspecified injury of head, initial encounter: Secondary | ICD-10-CM | POA: Diagnosis present

## 2018-08-25 DIAGNOSIS — S0083XA Contusion of other part of head, initial encounter: Secondary | ICD-10-CM | POA: Insufficient documentation

## 2018-08-25 DIAGNOSIS — W108XXA Fall (on) (from) other stairs and steps, initial encounter: Secondary | ICD-10-CM | POA: Diagnosis not present

## 2018-08-25 DIAGNOSIS — R040 Epistaxis: Secondary | ICD-10-CM | POA: Diagnosis not present

## 2018-08-25 DIAGNOSIS — S0121XA Laceration without foreign body of nose, initial encounter: Secondary | ICD-10-CM | POA: Diagnosis not present

## 2018-08-25 DIAGNOSIS — Y929 Unspecified place or not applicable: Secondary | ICD-10-CM | POA: Insufficient documentation

## 2018-08-25 DIAGNOSIS — S60512A Abrasion of left hand, initial encounter: Secondary | ICD-10-CM | POA: Insufficient documentation

## 2018-08-25 DIAGNOSIS — Y999 Unspecified external cause status: Secondary | ICD-10-CM | POA: Diagnosis not present

## 2018-08-25 DIAGNOSIS — W19XXXA Unspecified fall, initial encounter: Secondary | ICD-10-CM

## 2018-08-25 DIAGNOSIS — Y9301 Activity, walking, marching and hiking: Secondary | ICD-10-CM | POA: Diagnosis not present

## 2018-08-25 DIAGNOSIS — I10 Essential (primary) hypertension: Secondary | ICD-10-CM | POA: Diagnosis not present

## 2018-08-25 DIAGNOSIS — S01511A Laceration without foreign body of lip, initial encounter: Secondary | ICD-10-CM | POA: Insufficient documentation

## 2018-08-25 NOTE — ED Triage Notes (Addendum)
Pt here with mechanical fall today. Tripped going up stairs and fell onto face causing epistaxis, a left hand abrasion, a laceration to bridge of nose and bite to lower lip. All bleeding is controlled in triage. Patient is not on blood thinners.

## 2018-08-25 NOTE — ED Provider Notes (Signed)
[facility] EMERGENCY DEPARTMENT Provider Note   CSN: [id number] Arrival date & time: 08/25/18  1544     History   Chief Complaint Chief Complaint  Patient presents with  . Fall    HPI Amanda Hurley is a 85 y.o. female presenting with after a mechanical fall today. Daughter and caregiver are contributing historians. Caregiver reports patient tripped while going up the stairs and fell forward. Caregiver reports epistaxis, a wound on her lip, and a wound on her nose. Caregiver states bleeding was controlled with pressure. Caregiver states patient also hurt her left hand and has a wound over her left hand. Patient reports she is not on blood thinners. Caregiver denies LOC.  Caregiver states patient has been acting at baseline and typically ambulates with assistance. Patient denies headache, vomiting, dizziness, weakness, numbness, paresthesias, abdominal pain, neck pain or fever. Last tetanus shot was in July 2019.  HPI  Past Medical History:  Diagnosis Date  . AAA (abdominal aortic aneurysm) (HCC)   . Hyperlipidemia   . Hypertension     Patient Active Problem List   Diagnosis Date Noted  . Unexplained night sweats 12/23/2016  . Abnormal CXR 12/22/2016    Past Surgical History:  Procedure Laterality Date  . ABDOMINAL HYSTERECTOMY    . BOTOX INJECTION N/A 07/04/2016   Procedure: BOTOX INJECTION;  Surgeon: Vida Rigger, MD;  Location: [facility];  Service: Endoscopy;  Laterality: N/A;  . BOTOX INJECTION N/A 03/26/2018   Procedure: BOTOX INJECTION;  Surgeon: Vida Rigger, MD;  Location: [facility];  Service: Gastroenterology;  Laterality: N/A;  . ESOPHAGOGASTRODUODENOSCOPY (EGD) WITH PROPOFOL N/A 07/04/2016   Procedure: ESOPHAGOGASTRODUODENOSCOPY (EGD) WITH PROPOFOL;  Surgeon: Vida Rigger, MD;  Location: [facility];  Service: Endoscopy;  Laterality: N/A;  . ESOPHAGOGASTRODUODENOSCOPY (EGD) WITH PROPOFOL N/A 03/26/2018   Procedure: ESOPHAGOGASTRODUODENOSCOPY (EGD) WITH  PROPOFOL;  Surgeon: Vida Rigger, MD;  Location: [facility];  Service: Gastroenterology;  Laterality: N/A;  botox injection  . FOREIGN BODY REMOVAL  03/26/2018   Procedure: FOREIGN BODY REMOVAL;  Surgeon: Vida Rigger, MD;  Location: [facility];  Service: Gastroenterology;;     OB History   No obstetric history on file.      Home Medications    Prior to Admission medications   Medication Sig Start Date End Date Taking? Authorizing Provider  Calcium Carbonate-Vitamin D (CALTRATE 600+D PO) Take 1 tablet by mouth daily.    [provider]  Cranberry 450 MG CAPS Take 450 mg by mouth daily.    [provider]  Omega-3 Fatty Acids (FISH OIL PO) Take 1 capsule by mouth every Monday, Wednesday, and Friday.    [provider]  Propylene Glycol (SYSTANE COMPLETE) 0.6 % SOLN Place 1 drop into both eyes daily as needed (for dry eyes).     [provider]    Family History Family History  Problem Relation Age of Onset  . Hypertension Mother   . CAD Father   . Dementia Brother   . Cancer - Other Brother   . Leukemia Brother   . Breast cancer Neg Hx     Social History Social History   Tobacco Use  . Smoking status: Never Smoker  . Smokeless tobacco: Never Used  Substance Use Topics  . Alcohol use: No    Alcohol/week: 0.0 standard drinks  . Drug use: No     Allergies   Patient has no known allergies.   Review of Systems Review of Systems  Constitutional: Negative for  activity change, appetite change, chills, diaphoresis, fatigue, fever and unexpected weight change.  HENT: Negative for congestion and rhinorrhea.   Eyes: Negative for photophobia and visual disturbance.  Respiratory: Negative for shortness of breath.   Cardiovascular: Negative for chest pain.  Gastrointestinal: Negative for abdominal pain, nausea and vomiting.  Endocrine: Negative for cold intolerance and heat intolerance.  Genitourinary: Negative for dysuria and frequency.    Musculoskeletal: Negative for gait problem, myalgias, neck pain and neck stiffness.  Skin: Positive for wound.  Allergic/Immunologic: Negative for immunocompromised state.  Neurological: Negative for dizziness, seizures, syncope, speech difficulty, weakness, numbness and headaches.  Hematological: Does not bruise/bleed easily.  Psychiatric/Behavioral: Negative for sleep disturbance. The patient is not nervous/anxious.      Physical Exam Updated Vital Signs BP 136/74 (BP Location: Left Arm)   Pulse 96   Temp 98.1 F (36.7 C)   Resp 18   Ht 5\' 7"  (1.702 m)   Wt 57.2 kg   SpO2 99%   BMI 19.73 kg/m   Physical Exam Vitals signs and nursing note reviewed.  Constitutional:      General: She is not in acute distress.    Appearance: She is well-developed. She is not diaphoretic.  HENT:     Head: Normocephalic.     Right Ear: Tympanic membrane, ear canal and external ear normal.     Left Ear: Tympanic membrane, ear canal and external ear normal.     Nose: Laceration (Small shallow 5mm laceration noted over bridge of nose. Bleeding is controlled. ) present. No nasal deformity, congestion or rhinorrhea.     Right Nostril: No foreign body, epistaxis or septal hematoma.     Left Nostril: No foreign body, epistaxis or septal hematoma.     Mouth/Throat:     Mouth: Mucous membranes are moist.     Pharynx: No oropharyngeal exudate or posterior oropharyngeal erythema.  Eyes:     Extraocular Movements: Extraocular movements intact.     Conjunctiva/sclera: Conjunctivae normal.     Pupils: Pupils are equal, round, and reactive to light.  Cardiovascular:     Rate and Rhythm: Normal rate and regular rhythm.     Heart sounds: Normal heart sounds. No murmur. No friction rub. No gallop.   Pulmonary:     Effort: Pulmonary effort is normal. No respiratory distress.     Breath sounds: Normal breath sounds. No wheezing or rales.  Abdominal:     Palpations: Abdomen is soft.     Tenderness: There  is no abdominal tenderness.  Musculoskeletal: Normal range of motion.  Skin:    Findings: Signs of injury present. No erythema or rash.       Neurological:     Mental Status: She is alert and oriented to person, place, and time.    Mental Status:  Alert, oriented, thought content appropriate, able to give a coherent history. Speech fluent without evidence of aphasia. Able to follow 2 step commands without difficulty.  Cranial Nerves:  II:  Peripheral visual fields grossly normal, pupils equal, round, reactive to light III,IV, VI: ptosis not present, extra-ocular motions intact bilaterally  V,VII: smile symmetric, facial light touch sensation equal VIII: hearing grossly normal to voice  X: uvula elevates symmetrically  XI: bilateral shoulder shrug symmetric and strong XII: midline tongue extension without fassiculations Motor:  Normal tone. 5/5 in upper and lower extremities bilaterally including strong and equal grip strength and dorsiflexion/plantar flexion Sensory:  light touch normal in all extremities.  Deep Tendon Reflexes: 2+  and symmetric in the biceps and patella Cerebellar: normal finger-to-nose with bilateral upper extremities Gait: normal gait and balance.  CV: distal pulses palpable throughout    ED Treatments / Results  Labs (all labs ordered are listed, but only abnormal results are displayed) Labs Reviewed - No data to display  EKG None  Radiology Ct Head Wo Contrast  Result Date: 08/25/2018 CLINICAL DATA:  Headache and nasal swelling after falling forward. Forehead scalp hematoma. EXAM: CT HEAD WITHOUT CONTRAST TECHNIQUE: Contiguous axial images were obtained from the base of the skull through the vertex without intravenous contrast. COMPARISON:  02/04/2018. FINDINGS: Brain: Moderately enlarged ventricles and subarachnoid spaces. Marked patchy white matter low density in both cerebral hemispheres. No intracranial hemorrhage, mass lesion or CT evidence of acute  infarction. Vascular: No hyperdense vessel or unexpected calcification. Skull: Normal. Negative for fracture or focal lesion. Sinuses/Orbits: Status post bilateral cataract extraction. Unremarkable included paranasal sinuses. Other: Small right frontal scalp hematoma. Bilateral temporomandibular joint degenerative changes. IMPRESSION: 1. Small right frontal scalp hematoma without skull fracture or intracranial hemorrhage. 2. Moderate diffuse cerebral and cerebellar atrophy. 3. Marked chronic small vessel white matter ischemic changes in both cerebral hemispheres. 4. Bilateral TMJ degenerative changes. Electronically Signed   By: Beckie Salts M.D.   On: 08/25/2018 18:23   Dg Hand Complete Left  Result Date: 08/25/2018 CLINICAL DATA:  Left hand bruising laceration following a fall today. EXAM: LEFT HAND - COMPLETE 3+ VIEW COMPARISON:  10/16/2015. FINDINGS: Multi joint degenerative changes. No fracture or dislocation seen. Mild dorsal soft tissue swelling at the level of the MCP joints. IMPRESSION: 1. No fracture or dislocation. 2. Degenerative changes. Electronically Signed   By: Beckie Salts M.D.   On: 08/25/2018 18:24    Procedures Procedures (including critical care time)  Medications Ordered in ED Medications - No data to display   Initial Impression / Assessment and Plan / ED Course  I have reviewed the triage vital signs and the nursing notes.  Pertinent labs & imaging results that were available during my care of the patient were reviewed by me and considered in my medical decision making (see chart for details).  Clinical Course as of Aug 26 1907  Wynelle Link Aug 25, 2018  1827 Small right frontal scalp hematoma without skull fracture or intracranial hemorrhage. Moderate diffuse cerebral and cerebellar atrophy. Marked chronic small vessel white matter ischemic changes in both cerebral hemispheres. Bilateral TMJ degenerative changes.    CT Head Wo Contrast [AH]  1828 No fracture or dislocation.   DG Hand Complete Left [AH]    Clinical Course User Index [AH] Leretha Dykes, PA-C   Patient presents after a fall due to tripping on stairs. Neurological exam is normal. Patient has not had any pain while in the ER. Head CT reveals a small right frontal scalp hematoma without skull fracture or intracranial hemorrhage. Left hand x ray is negative for fracture or dislocation. Nose laceration and hand laceration were closed with steri strips. Lacerations were shallow and did not require sutures. Discussed return precautions with daughter and patient. Patient is stable and will discharge with symptomatic treatment. Advised patient to follow up with PCP in 2 days. Patient and daughter state they understand and agree with plan.   Final Clinical Impressions(s) / ED Diagnoses   Final diagnoses:  Fall, initial encounter    ED Discharge Orders    None       Leretha Dykes, [place] 08/25/18 1918    Tegeler, Cristal Deer  J, MD 08/26/18 [id number]

## 2018-08-25 NOTE — Discharge Instructions (Addendum)
You have been seen today after a fall.  Please read and follow all provided instructions.   1. Medications: tylenol for pain, usual home medications 2. Treatment: rest, drink plenty of fluids, ice 3. Follow Up: Please follow up with your primary doctor in 2 days for discussion of your diagnoses and further evaluation after today's visit; if you do not have a primary care doctor use the resource guide provided to find one; Please return to the ER for any new or worsening symptoms. Please obtain all of your results from medical records or have your doctors office obtain the results - share them with your doctor - you should be seen at your doctors office. Call today to arrange your follow up.   Take medications as prescribed. Please review all of the medicines and only take them if you do not have an allergy to them. Return to the emergency room for worsening condition or new concerning symptoms. Follow up with your regular doctor. If you don't have a regular doctor use one of the numbers below to establish a primary care doctor.  Please be aware that if you are taking birth control pills, taking other prescriptions, ESPECIALLY ANTIBIOTICS may make the birth control ineffective - if this is the case, either do not engage in sexual activity or use alternative methods of birth control such as condoms until you have finished the medicine and your family doctor says it is OK to restart them. If you are on a blood thinner such as COUMADIN, be aware that any other medicine that you take may cause the coumadin to either work too much, or not enough - you should have your coumadin level rechecked in next 7 days if this is the case.  ?  It is also a possibility that you have an allergic reaction to any of the medicines that you have been prescribed - Everybody reacts differently to medications and while MOST people have no trouble with most medicines, you may have a reaction such as nausea, vomiting, rash, swelling,  shortness of breath. If this is the case, please stop taking the medicine immediately and contact your physician.  ?  You should return to the ER if you develop severe or worsening symptoms.   Emergency Department Resource Guide 1) Find a Doctor and Pay Out of Pocket Although you won't have to find out who is covered by your insurance plan, it is a good idea to ask around and get recommendations. You will then need to call the office and see if the doctor you have chosen will accept you as a new patient and what types of options they offer for patients who are self-pay. Some doctors offer discounts or will set up payment plans for their patients who do not have insurance, but you will need to ask so you aren't surprised when you get to your appointment.  2) Contact Your Local Health Department Not all health departments have doctors that can see patients for sick visits, but many do, so it is worth a call to see if yours does. If you don't know where your local health department is, you can check in your phone book. The CDC also has a tool to help you locate your state's health department, and many state websites also have listings of all of their local health departments.  3) Find a Walk-in Clinic If your illness is not likely to be very severe or complicated, you may want to try a walk in clinic. These  are popping up all over the country in pharmacies, drugstores, and shopping centers. They're usually staffed by nurse practitioners or physician assistants that have been trained to treat common illnesses and complaints. They're usually fairly quick and inexpensive. However, if you have serious medical issues or chronic medical problems, these are probably not your best option.  No Primary Care Doctor: Call Health Connect at  [phone] - they can help you locate a primary care doctor that  accepts your insurance, provides certain services, etc. Physician Referral [phone]  Emergency  Department Resource Guide 1) Find a Doctor and Pay Out of Pocket Although you won't have to find out who is covered by your insurance plan, it is a good idea to ask around and get recommendations. You will then need to call the office and see if the doctor you have chosen will accept you as a new patient and what types of options they offer for patients who are self-pay. Some doctors offer discounts or will set up payment plans for their patients who do not have insurance, but you will need to ask so you aren't surprised when you get to your appointment.  2) Contact Your Local Health Department Not all health departments have doctors that can see patients for sick visits, but many do, so it is worth a call to see if yours does. If you don't know where your local health department is, you can check in your phone book. The CDC also has a tool to help you locate your state's health department, and many state websites also have listings of all of their local health departments.  3) Find a Walk-in Clinic If your illness is not likely to be very severe or complicated, you may want to try a walk in clinic. These are popping up all over the country in pharmacies, drugstores, and shopping centers. They're usually staffed by nurse practitioners or physician assistants that have been trained to treat common illnesses and complaints. They're usually fairly quick and inexpensive. However, if you have serious medical issues or chronic medical problems, these are probably not your best option.  No Primary Care Doctor: Call Health Connect at  [phone] - they can help you locate a primary care doctor that  accepts your insurance, provides certain services, etc. Physician Referral Service- [phone]  Chronic Pain Problems: Organization         Address  Phone   Notes  [facility]  [phone] Patients need to be referred by their primary care doctor.   Medication  Assistance: Organization         Address  Phone   Notes  [facility] [address] [place], [place] [address] [phone] --Must be a resident of [place] -- Must have NO insurance coverage whatsoever (no Medicaid/ Medicare, etc.) -- The pt. MUST have a primary care doctor that directs their care regularly and follows them in the community   [facility]  [phone]   [employer]  [phone]    Agencies that provide inexpensive medical care: Organization         Address  Phone   Notes  [facility]  [phone]   [facility]    [phone]   [facility] [address] [place], [place] [address] [phone]   [facility] [address], [place] [phone]   [facility]    (  [phone]   [facility]    [phone]   [facility]  [address], [place] Phone:  [phone], Fax:  [phone] Hours of Operation:  9 am - 6 pm, M-F.  Also accepts Medicaid/Medicare and self-pay.  [facility]  [address], [place] Phone: [phone], Fax: [phone]. Hours of Operation:  8:30 am - 5:30 pm, M-F.  Also accepts Medicaid and self-pay.  [facility] [address] [place] Phone: [phone]   [facility] [address] Natasha Bence [place], [place] [phone], Ext. [phone] Mondays & Thursdays: 7-9 AM.  First 15 patients are seen on a first come, first serve basis.    Medicaid-accepting [facility] Providers:  Organization         Address  Phone   Notes  [facility] [address], [place] [phone] Also accepts self-pay patients.  [facility] [address] Laurell Josephs [place], [place]  [phone]   [facility] [address], [place]  [phone]   [facility] [address] [place] [phone]   Renaye Rakers [address], [place]   [phone] Only accepts [place] patients after they have their name applied to their card.   Self-Pay (no insurance) in [facility]:  Organization         Address  Phone   Notes  Sickle Cell Patients, [facility] [address] [place], [place] [phone]   [facility] [address] [place], [place] [phone]   [facility]  [address], [place] [phone]   [facility]/Dr. Osei-Bonsu  [address] [place] or [address], [place] [phone] Phone number for both [place] and [place] locations is the same.  [facility] [address], [place] [phone]   [facility] [address], [place] or [address] [phone]   [facility] [address] [place] [phone], phone; [phone], fax Sees patients 1st and 3rd Saturday of every month.  Must not qualify for public or private insurance (i.e. Medicaid, Medicare, [place] Health Choice, Veterans' Benefits)  Household income should be no more than 200% of the poverty level The clinic cannot treat you if you are pregnant or think you are pregnant  Sexually transmitted diseases are not treated at the clinic.

## 2019-04-09 ENCOUNTER — Encounter: Payer: Self-pay | Admitting: *Deleted

## 2019-04-10 ENCOUNTER — Encounter: Payer: Self-pay | Admitting: Neurology

## 2019-04-10 ENCOUNTER — Telehealth: Payer: Self-pay | Admitting: *Deleted

## 2019-04-10 ENCOUNTER — Ambulatory Visit (INDEPENDENT_AMBULATORY_CARE_PROVIDER_SITE_OTHER): Payer: Medicare Other | Admitting: Neurology

## 2019-04-10 ENCOUNTER — Telehealth: Payer: Self-pay | Admitting: Neurology

## 2019-04-10 ENCOUNTER — Other Ambulatory Visit: Payer: Self-pay

## 2019-04-10 VITALS — BP 132/85 | HR 87 | Temp 98.6°F | Ht 67.0 in | Wt 128.0 lb

## 2019-04-10 DIAGNOSIS — G309 Alzheimer's disease, unspecified: Secondary | ICD-10-CM

## 2019-04-10 DIAGNOSIS — F03918 Unspecified dementia, unspecified severity, with other behavioral disturbance: Secondary | ICD-10-CM | POA: Insufficient documentation

## 2019-04-10 DIAGNOSIS — F0391 Unspecified dementia with behavioral disturbance: Secondary | ICD-10-CM

## 2019-04-10 MED ORDER — MEMANTINE HCL 5 MG PO TABS: 5.0000 mg | ORAL_TABLET | Freq: Two times a day (BID) | ORAL | 6 refills | Status: DC

## 2019-04-10 NOTE — Telephone Encounter (Signed)
Per Dr. Krista Blue request, called [facility] to see if pt has previously been prescribed namenda, aricept, memantine. Spoke to tech at pharmacy. Only medication they have tried in the past is olanzapine. Relayed to Dr. Krista Blue, pt/daughter.

## 2019-04-10 NOTE — Telephone Encounter (Signed)
[facility] medicare order sent to GI. No auth they will reach out to the patient to schedule.

## 2019-04-10 NOTE — Progress Notes (Signed)
PATIENT: Amanda Hurley DOB: Jan 28, 1929  Chief Complaint  Patient presents with  . New Patient (Initial Visit)    RM RM 4 with daughter, Amanda Hurley (temp: 98.2). Paper referral from Dr. Reynaldo Minium for Alzheimer's/Dementia. Pt having hallucinations. Son- Rush Landmark. MMSE- 16/30  . PCP    Burnard Bunting, MD     HISTORICAL  Amanda Hurley is a 85 year old female, seen in request by her primary care doctor Burnard Bunting for evaluation of memory loss, she is accompanied by her daughter Amanda Hurley at today's clinic visit on April 10, 2019.  I have reviewed and summarized the referring note from the referring physician.  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, was noted to have gradual onset memory loss since age 13s, her father suffered dementia, she lives alone, her memory loss gradually getting worse, she has 3 children, they began to take over her finance management, since 2017, she had a few episode of acute confusion, hallucinations, there is usually associated with UTI, she now has caregiver 6 hours each day, she can still dress, feed, bathe, toileting without assistant, no significant gait abnormality, but she continue has frequent visual and auditory hallucinations, which are quite bothersome for her sometimes, previously tried on olanzapine, has excessive sleepiness, has never tried Namenda or Aricept in the past.  She reported sleeping well most of the time, has history of esophageal diverticulum is on pured diet, has to take medicine mixed with applesauce,  Today's Mini-Mental Status Examination 16 out of 30  REVIEW OF SYSTEMS: Full 14 system review of systems performed and notable only for as above All other review of systems were negative.  ALLERGIES: No Known Allergies  HOME MEDICATIONS: Current Outpatient Medications  Medication Sig Dispense Refill  . Propylene Glycol (SYSTANE COMPLETE) 0.6 % SOLN  Place 1 drop into both eyes daily as needed (for dry eyes).      No current facility-administered medications for this visit.     PAST MEDICAL HISTORY: Past Medical History:  Diagnosis Date  . AAA (abdominal aortic aneurysm) ([place])   . Dementia ([place])   . Dysphagia   . Hyperlipidemia   . Hypertension     PAST SURGICAL HISTORY: Past Surgical History:  Procedure Laterality Date  . ABDOMINAL HYSTERECTOMY    . BOTOX INJECTION N/A 07/04/2016   Procedure: BOTOX INJECTION;  Surgeon: Clarene Essex, MD;  Location: [facility];  Service: Endoscopy;  Laterality: N/A;  . BOTOX INJECTION N/A 03/26/2018   Procedure: BOTOX INJECTION;  Surgeon: Clarene Essex, MD;  Location: [facility];  Service: Gastroenterology;  Laterality: N/A;  . ESOPHAGOGASTRODUODENOSCOPY (EGD) WITH PROPOFOL N/A 07/04/2016   Procedure: ESOPHAGOGASTRODUODENOSCOPY (EGD) WITH PROPOFOL;  Surgeon: Clarene Essex, MD;  Location: [facility];  Service: Endoscopy;  Laterality: N/A;  . ESOPHAGOGASTRODUODENOSCOPY (EGD) WITH PROPOFOL N/A 03/26/2018   Procedure: ESOPHAGOGASTRODUODENOSCOPY (EGD) WITH PROPOFOL;  Surgeon: Clarene Essex, MD;  Location: [facility];  Service: Gastroenterology;  Laterality: N/A;  botox injection  . FOREIGN BODY REMOVAL  03/26/2018   Procedure: FOREIGN BODY REMOVAL;  Surgeon: Clarene Essex, MD;  [facility];  Service: Gastroenterology;;    FAMILY HISTORY: Family History  Problem Relation Age of Onset  . Hypertension Mother   . Arthritis Mother   . Glaucoma Mother   . Stroke Mother   . CAD Father   . Arthritis Father   . Hypertension Father   . Dementia Brother   . Cancer - Other  Brother   . Leukemia Brother   . Breast cancer Neg Hx     SOCIAL HISTORY: Social History   Socioeconomic History  . Marital status: Widowed    Spouse name: Not on file  . Number of children: 3  . Years of education: 7812  . Highest education level: Not on file  Occupational History  . Occupation: Retired  Engineer, productionocial Needs   . Financial resource strain: Not on file  . Food insecurity    Worry: Not on file    Inability: Not on file  . Transportation needs    Medical: Not on file    Non-medical: Not on file  Tobacco Use  . Smoking status: Never Smoker  . Smokeless tobacco: Never Used  Substance and Sexual Activity  . Alcohol use: No    Alcohol/week: 0.0 standard drinks  . Drug use: No  . Sexual activity: Not on file  Lifestyle  . Physical activity    Days per week: Not on file    Minutes per session: Not on file  . Stress: Not on file  Relationships  . Social Musicianconnections    Talks on phone: Not on file    Gets together: Not on file    Attends religious service: Not on file    Active member of club or organization: Not on file    Attends meetings of clubs or organizations: Not on file    Relationship status: Not on file  . Intimate partner violence    Fear of current or ex partner: Not on file    Emotionally abused: Not on file    Physically abused: Not on file    Forced sexual activity: Not on file  Other Topics Concern  . Not on file  Social History Narrative   Lives alone   Right handed    Caffeine: Tea/coke daily   Some coffee     PHYSICAL EXAM   Vitals:   04/10/19 0951  BP: 132/85  Pulse: 87  Temp: 98.6 F (37 C)  Weight: 128 lb (58.1 kg)  Height: 5\' 7"  (1.702 m)    Not recorded      Body mass index is 20.05 kg/m.  PHYSICAL EXAMNIATION:  Gen: NAD, conversant, well nourised, well groomed                     Cardiovascular: Regular rate rhythm, no peripheral edema, warm, nontender. Eyes: Conjunctivae clear without exudates or hemorrhage Neck: Supple, no carotid bruits. Pulmonary: Clear to auscultation bilaterally   NEUROLOGICAL EXAM:  MMSE - Mini Mental State Exam 04/10/2019  Orientation to time 1  Orientation to Place 3  Registration 3  Attention/ Calculation 2  Recall 0  Language- name 2 objects 2  Language- repeat 0  Language- follow 3 step command 3   Language- read & follow direction 1  Write a sentence 1  Copy design 0  Total score 16     CRANIAL NERVES: CN II: Visual fields are full to confrontation.  Pupils are round equal and briskly reactive to light. CN III, IV, VI: extraocular movement are normal. No ptosis. CN V: Facial sensation is intact to pinprick in all 3 divisions bilaterally. Corneal responses are intact.  CN VII: Face is symmetric with normal eye closure and smile. CN VIII: Hearing is normal to causal conversation. CN IX, X: Palate elevates symmetrically. Phonation is normal. CN XI: Head turning and shoulder shrug are intact CN XII: Tongue is midline with normal  movements and no atrophy.  MOTOR: There is no pronator drift of out-stretched arms. Muscle bulk and tone are normal. Muscle strength is normal.  REFLEXES: Reflexes are 1 and symmetric at the biceps, triceps, knees, and ankles. Plantar responses are flexor.  SENSORY: Intact to light touch, pinprick and vibratory sensation  COORDINATION: Rapid alternating movements and fine finger movements are intact. There is no dysmetria on finger-to-nose and heel-knee-shin.    GAIT/STANCE: Need to push up to get up from seated position, steady   DIAGNOSTIC DATA (LABS, IMAGING, TESTING) - I reviewed patient records, labs, notes, testing and imaging myself where available.   ASSESSMENT AND PLAN  ROSSANNA HERDON is a 85 y.o. female   Dementia with visual hallucinations  Mini-Mental Status Examination 16 out of 30   Most consistent with central nervous system degenerative disorder, such as Alzheimer's dementia   Complete evaluation with MRI of brain,  Laboratory evaluation to rule out treatable etiology  Start Namenda 5 mg twice a day, the other option would be Aricept 10 mg daily, and low-dose Seroquel for agitation   Levert Feinstein, M.D. Ph.D.  [facility] [address], [address] [place], [place] [address] Ph: [phone] Fax: [phone]   CC: Geoffry Paradise, MD

## 2019-04-11 LAB — COMPREHENSIVE METABOLIC PANEL
ALT: 11 IU/L (ref 0–32)
AST: 22 IU/L (ref 0–40)
Albumin/Globulin Ratio: 1 — ABNORMAL LOW (ref 1.2–2.2)
Albumin: 4.1 g/dL (ref 3.6–4.6)
Alkaline Phosphatase: 83 IU/L (ref 39–117)
BUN/Creatinine Ratio: 16 (ref 12–28)
BUN: 17 mg/dL (ref 8–27)
Bilirubin Total: 1.7 mg/dL — ABNORMAL HIGH (ref 0.0–1.2)
CO2: 24 mmol/L (ref 20–29)
Calcium: 10 mg/dL (ref 8.7–10.3)
Chloride: 96 mmol/L (ref 96–106)
Creatinine, Ser: 1.06 mg/dL — ABNORMAL HIGH (ref 0.57–1.00)
GFR calc Af Amer: 55 mL/min/{1.73_m2} — ABNORMAL LOW (ref >59)
GFR calc non Af Amer: 47 mL/min/{1.73_m2} — ABNORMAL LOW (ref >59)
Globulin, Total: 4.1 g/dL (ref 1.5–4.5)
Glucose: 108 mg/dL — ABNORMAL HIGH (ref 65–99)
Potassium: 4.3 mmol/L (ref 3.5–5.2)
Sodium: 135 mmol/L (ref 134–144)
Total Protein: 8.2 g/dL (ref 6.0–8.5)

## 2019-04-11 LAB — CBC WITH DIFFERENTIAL/PLATELET
Basophils Absolute: 0.1 10*3/uL (ref 0.0–0.2)
Basos: 0 %
EOS (ABSOLUTE): 0.2 10*3/uL (ref 0.0–0.4)
Eos: 1 %
Hematocrit: 44.5 % (ref 34.0–46.6)
Hemoglobin: 15.1 g/dL (ref 11.1–15.9)
Immature Grans (Abs): 0 10*3/uL (ref 0.0–0.1)
Immature Granulocytes: 0 %
Lymphocytes Absolute: 2.7 10*3/uL (ref 0.7–3.1)
Lymphs: 15 %
MCH: 29.2 pg (ref 26.6–33.0)
MCHC: 33.9 g/dL (ref 31.5–35.7)
MCV: 86 fL (ref 79–97)
Monocytes Absolute: 1.5 10*3/uL — ABNORMAL HIGH (ref 0.1–0.9)
Monocytes: 9 %
Neutrophils Absolute: 13.4 10*3/uL — ABNORMAL HIGH (ref 1.4–7.0)
Neutrophils: 75 %
Platelets: 232 10*3/uL (ref 150–450)
RBC: 5.18 x10E6/uL (ref 3.77–5.28)
RDW: 12 % (ref 11.7–15.4)
WBC: 18 10*3/uL — ABNORMAL HIGH (ref 3.4–10.8)

## 2019-04-11 LAB — TSH: TSH: 2.39 u[IU]/mL (ref 0.450–4.500)

## 2019-04-11 LAB — VITAMIN D 25 HYDROXY (VIT D DEFICIENCY, FRACTURES): Vit D, 25-Hydroxy: 31.5 ng/mL (ref 30.0–100.0)

## 2019-04-11 LAB — VITAMIN B12: Vitamin B-12: 863 pg/mL (ref 232–1245)

## 2019-04-14 ENCOUNTER — Telehealth: Payer: Self-pay | Admitting: Neurology

## 2019-04-14 NOTE — Telephone Encounter (Signed)
I have spoken with her daughter, Aline August (on [place]) who verbalized understanding of the lab results.  States the patient has a pending appt with week with her PCP.

## 2019-04-14 NOTE — Telephone Encounter (Signed)
Please call patient, laboratory evaluation showed elevated WBC 18, with elevated absolute neutrophil 13.4, absolute monocyte, which is significantly elevated compared with number a year ago, I have faxed the results to her primary care physician Dr. Marin Roberts, she may contact him for further evaluation to rule out infection, chronic hematology/oncology disease,   There was also mildly elevated creatinine 1.06, she will benefit from increased water intake,  She has mildly decreased albumin 1.0,  Elevated bilirubin,    She has normal vitamin B-12, TSH, Vitamin D level.

## 2019-05-06 ENCOUNTER — Other Ambulatory Visit: Payer: Medicare Other

## 2019-05-07 ENCOUNTER — Other Ambulatory Visit: Payer: Medicare Other

## 2019-07-05 ENCOUNTER — Emergency Department (HOSPITAL_COMMUNITY): Payer: Medicare Other

## 2019-07-05 ENCOUNTER — Other Ambulatory Visit: Payer: Self-pay

## 2019-07-05 ENCOUNTER — Encounter (HOSPITAL_COMMUNITY): Payer: Self-pay | Admitting: *Deleted

## 2019-07-05 ENCOUNTER — Emergency Department (HOSPITAL_COMMUNITY)
Admission: EM | Admit: 2019-07-05 | Discharge: 2019-07-05 | Disposition: A | Discharge status: Home / Self Care | Payer: Medicare Other | Attending: Emergency Medicine | Admitting: Emergency Medicine

## 2019-07-05 DIAGNOSIS — W19XXXA Unspecified fall, initial encounter: Secondary | ICD-10-CM

## 2019-07-05 DIAGNOSIS — Y92019 Unspecified place in single-family (private) house as the place of occurrence of the external cause: Secondary | ICD-10-CM | POA: Diagnosis not present

## 2019-07-05 DIAGNOSIS — Z79899 Other long term (current) drug therapy: Secondary | ICD-10-CM | POA: Insufficient documentation

## 2019-07-05 DIAGNOSIS — Y999 Unspecified external cause status: Secondary | ICD-10-CM | POA: Insufficient documentation

## 2019-07-05 DIAGNOSIS — R109 Unspecified abdominal pain: Secondary | ICD-10-CM | POA: Insufficient documentation

## 2019-07-05 DIAGNOSIS — W1830XA Fall on same level, unspecified, initial encounter: Secondary | ICD-10-CM | POA: Diagnosis not present

## 2019-07-05 DIAGNOSIS — Y939 Activity, unspecified: Secondary | ICD-10-CM | POA: Insufficient documentation

## 2019-07-05 DIAGNOSIS — I10 Essential (primary) hypertension: Secondary | ICD-10-CM | POA: Diagnosis not present

## 2019-07-05 DIAGNOSIS — N390 Urinary tract infection, site not specified: Secondary | ICD-10-CM | POA: Diagnosis not present

## 2019-07-05 DIAGNOSIS — S0083XA Contusion of other part of head, initial encounter: Secondary | ICD-10-CM | POA: Diagnosis present

## 2019-07-05 DIAGNOSIS — F0390 Unspecified dementia without behavioral disturbance: Secondary | ICD-10-CM | POA: Diagnosis not present

## 2019-07-05 DIAGNOSIS — R197 Diarrhea, unspecified: Secondary | ICD-10-CM | POA: Insufficient documentation

## 2019-07-05 LAB — URINALYSIS, ROUTINE W REFLEX MICROSCOPIC
Bilirubin Urine: NEGATIVE
Glucose, UA: NEGATIVE mg/dL
Hgb urine dipstick: NEGATIVE
Ketones, ur: NEGATIVE mg/dL
Nitrite: NEGATIVE
Protein, ur: NEGATIVE mg/dL
Specific Gravity, Urine: 1.026 (ref 1.005–1.030)
pH: 8 (ref 5.0–8.0)

## 2019-07-05 LAB — CBC WITH DIFFERENTIAL/PLATELET
Abs Immature Granulocytes: 0.09 10*3/uL — ABNORMAL HIGH (ref 0.00–0.07)
Basophils Absolute: 0 10*3/uL (ref 0.0–0.1)
Basophils Relative: 0 %
Eosinophils Absolute: 0.1 10*3/uL (ref 0.0–0.5)
Eosinophils Relative: 0 %
HCT: 43.9 % (ref 36.0–46.0)
Hemoglobin: 14.4 g/dL (ref 12.0–15.0)
Immature Granulocytes: 1 %
Lymphocytes Relative: 3 %
Lymphs Abs: 0.5 10*3/uL — ABNORMAL LOW (ref 0.7–4.0)
MCH: 29.8 pg (ref 26.0–34.0)
MCHC: 32.8 g/dL (ref 30.0–36.0)
MCV: 90.7 fL (ref 80.0–100.0)
Monocytes Absolute: 1 10*3/uL (ref 0.1–1.0)
Monocytes Relative: 5 %
Neutro Abs: 16.9 10*3/uL — ABNORMAL HIGH (ref 1.7–7.7)
Neutrophils Relative %: 91 %
Platelets: 187 10*3/uL (ref 150–400)
RBC: 4.84 MIL/uL (ref 3.87–5.11)
RDW: 12.7 % (ref 11.5–15.5)
WBC: 18.6 10*3/uL — ABNORMAL HIGH (ref 4.0–10.5)
nRBC: 0 % (ref 0.0–0.2)

## 2019-07-05 LAB — BASIC METABOLIC PANEL
Anion gap: 11 (ref 5–15)
BUN: 16 mg/dL (ref 8–23)
CO2: 24 mmol/L (ref 22–32)
Calcium: 9.5 mg/dL (ref 8.9–10.3)
Chloride: 101 mmol/L (ref 98–111)
Creatinine, Ser: 1.05 mg/dL — ABNORMAL HIGH (ref 0.44–1.00)
GFR calc Af Amer: 55 mL/min — ABNORMAL LOW (ref >60)
GFR calc non Af Amer: 47 mL/min — ABNORMAL LOW (ref >60)
Glucose, Bld: 119 mg/dL — ABNORMAL HIGH (ref 70–99)
Potassium: 4.1 mmol/L (ref 3.5–5.1)
Sodium: 136 mmol/L (ref 135–145)

## 2019-07-05 LAB — CK: Total CK: 120 U/L (ref 38–234)

## 2019-07-05 MED ORDER — SODIUM CHLORIDE 0.9 % IV BOLUS
1000.0000 mL | Freq: Once | INTRAVENOUS | Status: AC
  Administered 2019-07-05: 1000 mL via INTRAVENOUS

## 2019-07-05 MED ORDER — IOHEXOL 300 MG/ML  SOLN
100.0000 mL | Freq: Once | INTRAMUSCULAR | Status: AC | PRN
  Administered 2019-07-05: 11:00:00 100 mL via INTRAVENOUS

## 2019-07-05 NOTE — ED Triage Notes (Signed)
PT lives alone but daughter was at Pt home this AM and found Pt on the floor. Pt has diarrhea and cloths were covered in stool on arrival to ed. Pt does not remember the fall. Pt reports ABD pain . Pt p[lace in C -collar by EMS. Pt was seen for A UTI on Friday. EMS vitals 120/70 , HR 78, SPOA 98 RA, CBG 191.

## 2019-07-05 NOTE — ED Notes (Signed)
Got patient undress cleaned up patient was covered in feces did ekg shown to Dr Melina Copa patient is resting with call bell in reach

## 2019-07-05 NOTE — ED Notes (Signed)
D/c instructions reviewed with pt's daughter, pt is to continue antibiotics for UTI and follow up with her pcp for recheck of her urine in 1 week. VSS, NAD.

## 2019-07-05 NOTE — ED Provider Notes (Signed)
[facility] EMERGENCY DEPARTMENT Provider Note   CSN: [id number] Arrival date & time: 07/05/19  4098     History   Chief Complaint Chief Complaint  Patient presents with   Fall    HPI Amanda Hurley is a 85 y.o. female.  Level 5 caveat secondary to dementia.  85 year old female lives alone daughter found this morning on the floor.  Patient recalls a fall but cannot explain the events and thinks she might of fallen yesterday.  She was in her pajamas but had diarrhea all all over her.  Patient does not recall having diarrhea.  She endorses some abdominal pain but then denied it on requestioning.     The history is provided by the patient.  Fall This is a recurrent problem. Episode onset: unknown. Associated symptoms include abdominal pain. Pertinent negatives include no chest pain, no headaches and no shortness of breath. Nothing aggravates the symptoms. Nothing relieves the symptoms. She has tried nothing for the symptoms. The treatment provided no relief.    Past Medical History:  Diagnosis Date   AAA (abdominal aortic aneurysm) ([facility])    Dementia ([facility])    Dysphagia    Hyperlipidemia    Hypertension     Patient Active Problem List   Diagnosis Date Noted   Dementia with behavioral disturbance ([facility]) 04/10/2019   Unexplained night sweats 12/23/2016   Abnormal CXR 12/22/2016    Past Surgical History:  Procedure Laterality Date   ABDOMINAL HYSTERECTOMY     BOTOX INJECTION N/A 07/04/2016   Procedure: BOTOX INJECTION;  Surgeon: Vida Rigger, MD;  [facility];  Service: Endoscopy;  Laterality: N/A;   BOTOX INJECTION N/A 03/26/2018   Procedure: BOTOX INJECTION;  Surgeon: Vida Rigger, MD;  Location: [facility];  Service: Gastroenterology;  Laterality: N/A;   ESOPHAGOGASTRODUODENOSCOPY (EGD) WITH PROPOFOL N/A 07/04/2016   Procedure: ESOPHAGOGASTRODUODENOSCOPY (EGD) WITH PROPOFOL;  Surgeon: Vida Rigger, MD;  Location: [facility];  Service:  Endoscopy;  Laterality: N/A;   ESOPHAGOGASTRODUODENOSCOPY (EGD) WITH PROPOFOL N/A 03/26/2018   Procedure: ESOPHAGOGASTRODUODENOSCOPY (EGD) WITH PROPOFOL;  Surgeon: Vida Rigger, MD;  [facility]: [facility];  Service: Gastroenterology;  Laterality: N/A;  botox injection   FOREIGN BODY REMOVAL  03/26/2018   Procedure: FOREIGN BODY REMOVAL;  Surgeon: Vida Rigger, MD;  [facility];  Service: Gastroenterology;;     OB History   No obstetric history on file.      Home Medications    Prior to Admission medications   Medication Sig Start Date End Date Taking? Authorizing Provider  memantine (NAMENDA) 5 MG tablet Take 1 tablet (5 mg total) by mouth 2 (two) times daily. 04/10/19   Levert Feinstein, MD  Propylene Glycol (SYSTANE COMPLETE) 0.6 % SOLN Place 1 drop into both eyes daily as needed (for dry eyes).     [provider]    Family History Family History  Problem Relation Age of Onset   Hypertension Mother    Arthritis Mother    Glaucoma Mother    Stroke Mother    CAD Father    Arthritis Father    Hypertension Father    Dementia Brother    Cancer - Other Brother    Leukemia Brother    Breast cancer Neg Hx     Social History Social History   Tobacco Use   Smoking status: Never Smoker   Smokeless tobacco: Never Used  Substance Use Topics   Alcohol use: No    Alcohol/week: 0.0 standard drinks  Drug use: No     Allergies   Patient has no known allergies.   Review of Systems Review of Systems  Unable to perform ROS: Dementia  Respiratory: Negative for shortness of breath.   Cardiovascular: Negative for chest pain.  Gastrointestinal: Positive for abdominal pain.  Neurological: Negative for headaches.     Physical Exam Updated Vital Signs BP 119/69    Pulse 77    Temp 98.3 F (36.8 C) (Oral)    Resp 14    SpO2 99%   Physical Exam Vitals signs and nursing note reviewed.  Constitutional:      General: She is not in acute  distress.    Appearance: She is well-developed.  HENT:     Head: Normocephalic.     Comments: She has a small amount of ecchymosis around her lateral right orbit.  No crepitus or depressions. Eyes:     Conjunctiva/sclera: Conjunctivae normal.  Neck:     Musculoskeletal: Neck supple.  Cardiovascular:     Rate and Rhythm: Normal rate and regular rhythm.     Heart sounds: No murmur.  Pulmonary:     Effort: Pulmonary effort is normal. No respiratory distress.     Breath sounds: Normal breath sounds.  Abdominal:     Palpations: Abdomen is soft.     Tenderness: There is no abdominal tenderness. There is no guarding or rebound.  Musculoskeletal: Normal range of motion.        General: No deformity or signs of injury.  Skin:    General: Skin is warm and dry.     Capillary Refill: Capillary refill takes less than 2 seconds.  Neurological:     Mental Status: She is alert. Mental status is at baseline.     Comments: Patient is awake and alert.  Not oriented to place or time.  Not oriented to recent events.  Moving all 4 extremities without any limitations.  Following commands.      ED Treatments / Results  Labs (all labs ordered are listed, but only abnormal results are displayed) Labs Reviewed  BASIC METABOLIC PANEL - Abnormal; Notable for the following components:      Result Value   Glucose, Bld 119 (*)    Creatinine, Ser 1.05 (*)    GFR calc non Af Amer 47 (*)    GFR calc Af Amer 55 (*)    All other components within normal limits  CBC WITH DIFFERENTIAL/PLATELET - Abnormal; Notable for the following components:   WBC 18.6 (*)    Neutro Abs 16.9 (*)    Lymphs Abs 0.5 (*)    Abs Immature Granulocytes 0.09 (*)    All other components within normal limits  URINALYSIS, ROUTINE W REFLEX MICROSCOPIC - Abnormal; Notable for the following components:   APPearance HAZY (*)    Leukocytes,Ua LARGE (*)    Bacteria, UA RARE (*)    All other components within normal limits  URINE  CULTURE  CK    EKG EKG Interpretation  Date/Time:  Saturday July 05 2019 08:25:11 EST Ventricular Rate:  84 PR Interval:    QRS Duration: 86 QT Interval:  405 QTC Calculation: 479 R Axis:   29 Text Interpretation: Sinus rhythm Prolonged PR interval similar to prior today Confirmed by Meridee Score [phone]) on 07/05/2019 10:46:11 AM   Radiology Ct Head Wo Contrast  Result Date: 07/05/2019 CLINICAL DATA:  Head trauma. Found on floor. EXAM: CT HEAD WITHOUT CONTRAST CT CERVICAL SPINE WITHOUT CONTRAST TECHNIQUE: Multidetector CT  imaging of the head and cervical spine was performed following the standard protocol without intravenous contrast. Multiplanar CT image reconstructions of the cervical spine were also generated. COMPARISON:  CT head 08/25/2018 and CT cervical spine 02/04/18 FINDINGS: CT HEAD FINDINGS Brain: No evidence of acute infarction, hemorrhage, hydrocephalus, extra-axial collection or mass lesion/mass effect. There is mild diffuse low-attenuation within the subcortical and periventricular white matter compatible with chronic microvascular disease. Prominence of the sulci and ventricles compatible with brain atrophy. Vascular: No hyperdense vessel or unexpected calcification. Skull: Normal. Negative for fracture or focal lesion. Sinuses/Orbits: Mastoid air cells and paranasal sinuses are clear. Other: None. CT CERVICAL SPINE FINDINGS Alignment: Reversal of normal cervical lordosis. Skull base and vertebrae: No acute fracture. No primary bone lesion or focal pathologic process. Soft tissues and spinal canal: No prevertebral fluid or swelling. No visible canal hematoma. Disc levels: Multi level disc space narrowing and endplate spurring noted throughout the cervical spine. Most advanced at C5-6 and C6-7. Upper chest: Negative. Other: None IMPRESSION: 1. No acute intracranial abnormalities. Chronic small vessel ischemic change and brain atrophy. 2. No evidence for cervical spine fracture.  3. Advanced cervical spondylosis. Electronically Signed   By: Signa Kellaylor  Stroud M.D.   On: 07/05/2019 11:27   Ct Cervical Spine Wo Contrast  Result Date: 07/05/2019 CLINICAL DATA:  Head trauma. Found on floor. EXAM: CT HEAD WITHOUT CONTRAST CT CERVICAL SPINE WITHOUT CONTRAST TECHNIQUE: Multidetector CT imaging of the head and cervical spine was performed following the standard protocol without intravenous contrast. Multiplanar CT image reconstructions of the cervical spine were also generated. COMPARISON:  CT head 08/25/2018 and CT cervical spine 02/04/18 FINDINGS: CT HEAD FINDINGS Brain: No evidence of acute infarction, hemorrhage, hydrocephalus, extra-axial collection or mass lesion/mass effect. There is mild diffuse low-attenuation within the subcortical and periventricular white matter compatible with chronic microvascular disease. Prominence of the sulci and ventricles compatible with brain atrophy. Vascular: No hyperdense vessel or unexpected calcification. Skull: Normal. Negative for fracture or focal lesion. Sinuses/Orbits: Mastoid air cells and paranasal sinuses are clear. Other: None. CT CERVICAL SPINE FINDINGS Alignment: Reversal of normal cervical lordosis. Skull base and vertebrae: No acute fracture. No primary bone lesion or focal pathologic process. Soft tissues and spinal canal: No prevertebral fluid or swelling. No visible canal hematoma. Disc levels: Multi level disc space narrowing and endplate spurring noted throughout the cervical spine. Most advanced at C5-6 and C6-7. Upper chest: Negative. Other: None IMPRESSION: 1. No acute intracranial abnormalities. Chronic small vessel ischemic change and brain atrophy. 2. No evidence for cervical spine fracture. 3. Advanced cervical spondylosis. Electronically Signed   By: Signa Kellaylor  Stroud M.D.   On: 07/05/2019 11:27   Ct Abdomen Pelvis W Contrast  Result Date: 07/05/2019 CLINICAL DATA:  Patient found on floor. Fall. Urinary tract infection. EXAM: CT  ABDOMEN AND PELVIS WITH CONTRAST TECHNIQUE: Multidetector CT imaging of the abdomen and pelvis was performed using the standard protocol following bolus administration of intravenous contrast. CONTRAST:  100mL OMNIPAQUE IOHEXOL 300 MG/ML  SOLN COMPARISON:  None. FINDINGS: Lower chest: Interlobular septal thickening. No focal consolidation. Mild bronchiectasis. Hepatobiliary: No focal hepatic lesion. No biliary duct dilatation. Gallbladder is normal. Common bile duct is normal. Pancreas: Pancreas is normal. No ductal dilatation. No pancreatic inflammation. Spleen: Normal spleen Adrenals/urinary tract: Adrenal glands normal. Low-density lesion of the LEFT kidney is too small to characterize. No hydronephrosis. Ureters normal. Extrarenal pelvis on the RIGHT. No bladder calculi Stomach/Bowel: Large hiatal hernia. Stomach normal. Duodenum diverticulum. Stomach, small-bowel and  cecum are normal. The appendix is not identified but there is no pericecal inflammation to suggest appendicitis. Multiple diverticula of the descending colon and sigmoid colon without acute inflammation. Vascular/Lymphatic: Abdominal aorta is normal caliber with atherosclerotic calcification. There is no retroperitoneal or periportal lymphadenopathy. No pelvic lymphadenopathy. Reproductive: Post hysterectomy Other: No free fluid. Musculoskeletal: Superior endplate compression fracture at L4 approximately 20% loss vertebral body height. Probable chronic fracture. IMPRESSION: 1. Age indeterminate compression fracture at L4. No secondary signs of acute fracture. 2. Large hiatal hernia. 3. LEFT colon diverticulosis without diverticulitis. 4. Aortic Atherosclerosis (ICD10-I70.0). Electronically Signed   By: Suzy Bouchard M.D.   On: 07/05/2019 11:27    Procedures Procedures (including critical care time)  Medications Ordered in ED Medications  sodium chloride 0.9 % bolus 1,000 mL (0 mLs Intravenous Stopped 07/05/19 1213)  iohexol (OMNIPAQUE)  300 MG/ML solution 100 mL (100 mLs Intravenous Contrast Given 07/05/19 1059)     Initial Impression / Assessment and Plan / ED Course  I have reviewed the triage vital signs and the nursing notes.  Pertinent labs & imaging results that were available during my care of the patient were reviewed by me and considered in my medical decision making (see chart for details).  Clinical Course as of Jul 04 945  Sat Jul 04, 4273  8341 85 year old female with history of dementia here after being found on the floor covered in diarrhea.  Differential includes fall, bleed, stroke, cervical fracture, metabolic derangement, infection, rhabdo   [MB]  [id number] Patient's daughter is here now.  She said that she fell around 6 AM.  The diarrhea is a new thing.  She feels she is otherwise at her baseline now.  She said she was recently diagnosed with a urinary tract infection and given Bactrim for which she has had 1 dose.  He has difficulty swallowing pills.   [MB]    Clinical Course User Index [MB] Hayden Rasmussen, MD        Final Clinical Impressions(s) / ED Diagnoses   Final diagnoses:  Fall, initial encounter  Contusion of face, initial encounter  Lower urinary tract infectious disease    ED Discharge Orders    None       Hayden Rasmussen, MD 07/05/19 [id number]

## 2019-07-05 NOTE — ED Notes (Signed)
Patient transported to CT 

## 2019-07-05 NOTE — Discharge Instructions (Addendum)
You were seen in the emergency department for evaluation of injuries from a fall.  You had some bruising on your face but you had a CAT scan of your head that did not show any serious internal bleeding.  Your urine showed signs of urine infection and you should finish your antibiotics.  Please follow-up with your doctor and return if any worsening symptoms.

## 2019-07-05 NOTE — ED Notes (Signed)
D&C IV 

## 2019-07-07 LAB — URINE CULTURE
Culture: 40000 — AB
Special Requests: NORMAL

## 2019-07-08 ENCOUNTER — Telehealth: Payer: Self-pay | Admitting: *Deleted

## 2019-07-08 NOTE — Telephone Encounter (Signed)
Post ED Visit - Positive Culture Follow-up  Culture report reviewed by antimicrobial stewardship pharmacist: [place] Team []  Elenor Quinones, Pharm.D. []  Heide Guile, Pharm.D., BCPS AQ-ID []  Parks Neptune, Pharm.D., BCPS []  Alycia Rossetti, Pharm.D., BCPS []  [place], Pharm.D., BCPS, AAHIVP []  Legrand Como, Pharm.D., BCPS, AAHIVP []  Salome Arnt, PharmD, BCPS []  Johnnette Gourd, PharmD, BCPS []  Hughes Better, PharmD, BCPS []  Leeroy Cha, PharmD []  Laqueta Linden, PharmD, BCPS []  Albertina Parr, PharmD  [place] Team []  Leodis Sias, PharmD []  Lindell Spar, PharmD []  Royetta Asal, PharmD []  Graylin Shiver, Rph []  Rema Fendt) Glennon Mac, PharmD []  Arlyn Dunning, PharmD []  Netta Cedars, PharmD []  Dia Sitter, PharmD []  Leone Haven, PharmD []  Gretta Arab, PharmD []  Theodis Shove, PharmD []  Peggyann Juba, PharmD []  Reuel Boom, PharmD Elicia Lamp, PharmD   Positive urine culture Treated with Bactrim, organism sensitive to the same and no further patient follow-up is required at this time.  Amanda Hurley 07/08/2019, 1:00 PM

## 2019-07-10 DIAGNOSIS — R5383 Other fatigue: Secondary | ICD-10-CM | POA: Insufficient documentation

## 2019-08-12 ENCOUNTER — Ambulatory Visit: Payer: Medicare Other | Admitting: Neurology

## 2019-09-07 ENCOUNTER — Ambulatory Visit: Payer: Medicare Other | Attending: Internal Medicine

## 2019-09-07 DIAGNOSIS — Z23 Encounter for immunization: Secondary | ICD-10-CM | POA: Insufficient documentation

## 2019-09-07 NOTE — Progress Notes (Signed)
   Covid-19 Vaccination Clinic  Name:  Amanda Hurley    MRN: [id number] DOB: 04-04-1929  09/07/2019  [name] was observed post Covid-19 immunization for 15 minutes without incidence. She was provided with Vaccine Information Sheet and instruction to access the V-Safe system.   [name] was instructed to call 911 with any severe reactions post vaccine: Marland Kitchen Difficulty breathing  . Swelling of your face and throat  . A fast heartbeat  . A bad rash all over your body  . Dizziness and weakness    Immunizations Administered    Name Date Dose VIS Date Route   Pfizer COVID-19 Vaccine 09/07/2019  2:40 PM 0.3 mL 07/11/2019 Intramuscular   Manufacturer: [employer], [employer]   Lot: [id number]   NDC: [address]-4

## 2019-09-28 ENCOUNTER — Ambulatory Visit: Payer: Medicare Other

## 2019-10-02 ENCOUNTER — Ambulatory Visit: Payer: Medicare Other | Attending: Internal Medicine

## 2019-10-02 DIAGNOSIS — Z23 Encounter for immunization: Secondary | ICD-10-CM | POA: Insufficient documentation

## 2019-10-02 NOTE — Progress Notes (Signed)
   Covid-19 Vaccination Clinic  Name:  Amanda Hurley    MRN: [id number] DOB: Jul 13, 1929  10/02/2019  [name] was observed post Covid-19 immunization for 15 minutes without incident. She was provided with Vaccine Information Sheet and instruction to access the V-Safe system.   [name] was instructed to call 911 with any severe reactions post vaccine: Marland Kitchen Difficulty breathing  . Swelling of face and throat  . A fast heartbeat  . A bad rash all over body  . Dizziness and weakness   Immunizations Administered    Name Date Dose VIS Date Route   Pfizer COVID-19 Vaccine 10/02/2019  9:25 AM 0.3 mL 07/11/2019 Intramuscular   Manufacturer: [employer], [employer]   Lot: [id number]   NDC: [address]-9

## 2019-10-21 DIAGNOSIS — N1831 Chronic kidney disease, stage 3a: Secondary | ICD-10-CM | POA: Insufficient documentation

## 2020-01-09 ENCOUNTER — Other Ambulatory Visit: Payer: Self-pay | Admitting: Gastroenterology

## 2020-01-23 ENCOUNTER — Inpatient Hospital Stay (HOSPITAL_COMMUNITY): Admission: RE | Admit: 2020-01-23 | Payer: Medicare Other | Source: Ambulatory Visit

## 2020-01-26 ENCOUNTER — Other Ambulatory Visit (HOSPITAL_COMMUNITY)
Admission: RE | Admit: 2020-01-26 | Discharge: 2020-01-26 | Disposition: A | Discharge status: Home / Self Care | Payer: Medicare Other | Source: Ambulatory Visit | Attending: Gastroenterology | Admitting: Gastroenterology

## 2020-01-26 DIAGNOSIS — Z01812 Encounter for preprocedural laboratory examination: Secondary | ICD-10-CM | POA: Insufficient documentation

## 2020-01-26 DIAGNOSIS — Z20822 Contact with and (suspected) exposure to covid-19: Secondary | ICD-10-CM | POA: Diagnosis not present

## 2020-01-26 LAB — SARS CORONAVIRUS 2 (TAT 6-24 HRS): SARS Coronavirus 2: NEGATIVE

## 2020-01-26 NOTE — Progress Notes (Signed)
Pre call completed with patients daughter. Patient has dementia and needs assistance with admitting patient to hospital tomorrow. Patients son Annette Stable will be bringing patient to hospital tomorrow. Instructed to arrive by 7am. Also instructed to be NPO after midnight and to quarantine at home post covid test.

## 2020-01-27 ENCOUNTER — Ambulatory Visit (HOSPITAL_COMMUNITY): Payer: Medicare Other | Admitting: Anesthesiology

## 2020-01-27 ENCOUNTER — Ambulatory Visit (HOSPITAL_COMMUNITY)
Admission: RE | Admit: 2020-01-27 | Discharge: 2020-01-27 | Disposition: A | Discharge status: Home / Self Care | Payer: Medicare Other | Attending: Gastroenterology | Admitting: Gastroenterology

## 2020-01-27 ENCOUNTER — Other Ambulatory Visit: Payer: Self-pay

## 2020-01-27 ENCOUNTER — Encounter (HOSPITAL_COMMUNITY)
Admission: RE | Disposition: A | Discharge status: Home / Self Care | Payer: Self-pay | Source: Home / Self Care | Attending: Gastroenterology

## 2020-01-27 ENCOUNTER — Encounter (HOSPITAL_COMMUNITY): Payer: Self-pay | Admitting: Gastroenterology

## 2020-01-27 DIAGNOSIS — K222 Esophageal obstruction: Secondary | ICD-10-CM | POA: Insufficient documentation

## 2020-01-27 DIAGNOSIS — K449 Diaphragmatic hernia without obstruction or gangrene: Secondary | ICD-10-CM | POA: Insufficient documentation

## 2020-01-27 DIAGNOSIS — R1310 Dysphagia, unspecified: Secondary | ICD-10-CM | POA: Diagnosis present

## 2020-01-27 DIAGNOSIS — I10 Essential (primary) hypertension: Secondary | ICD-10-CM | POA: Insufficient documentation

## 2020-01-27 DIAGNOSIS — K2940 Chronic atrophic gastritis without bleeding: Secondary | ICD-10-CM | POA: Insufficient documentation

## 2020-01-27 DIAGNOSIS — Q396 Congenital diverticulum of esophagus: Secondary | ICD-10-CM | POA: Diagnosis not present

## 2020-01-27 HISTORY — PX: SUBMUCOSAL INJECTION: SHX5543

## 2020-01-27 HISTORY — PX: ESOPHAGOGASTRODUODENOSCOPY (EGD) WITH PROPOFOL: SHX5813

## 2020-01-27 SURGERY — ESOPHAGOGASTRODUODENOSCOPY (EGD) WITH PROPOFOL
Anesthesia: Monitor Anesthesia Care

## 2020-01-27 MED ORDER — SODIUM CHLORIDE 0.9 % IV SOLN: INTRAVENOUS | Status: DC

## 2020-01-27 MED ORDER — PROPOFOL 500 MG/50ML IV EMUL
INTRAVENOUS | Status: DC | PRN
  Administered 2020-01-27: 75 ug/kg/min via INTRAVENOUS

## 2020-01-27 MED ORDER — ONABOTULINUMTOXINA 100 UNITS IJ SOLR
INTRAMUSCULAR | Status: AC
  Filled 2020-01-27: qty 100

## 2020-01-27 MED ORDER — SODIUM CHLORIDE (PF) 0.9 % IJ SOLN
INTRAMUSCULAR | Status: DC | PRN
  Administered 2020-01-27: 4 mL via SUBMUCOSAL

## 2020-01-27 MED ORDER — PROPOFOL 500 MG/50ML IV EMUL
INTRAVENOUS | Status: DC | PRN
  Administered 2020-01-27: 30 mg via INTRAVENOUS

## 2020-01-27 MED ORDER — PROPOFOL 500 MG/50ML IV EMUL
INTRAVENOUS | Status: AC
  Filled 2020-01-27: qty 50

## 2020-01-27 MED ORDER — LACTATED RINGERS IV SOLN
INTRAVENOUS | Status: DC
  Administered 2020-01-27: 1000 mL via INTRAVENOUS

## 2020-01-27 SURGICAL SUPPLY — 15 items

## 2020-01-27 NOTE — Transfer of Care (Signed)
Immediate Anesthesia Transfer of Care Note  Patient: Amanda Hurley  Procedure(s) Performed: ESOPHAGOGASTRODUODENOSCOPY (EGD) WITH PROPOFOL (N/A ) SUBMUCOSAL INJECTION  Patient Location: PACU  Anesthesia Type:MAC  Level of Consciousness: drowsy, patient cooperative and responds to stimulation  Airway & Oxygen Therapy: Patient Spontanous Breathing and Patient connected to face mask oxygen  Post-op Assessment: Report given to RN and Post -op Vital signs reviewed and stable  Post vital signs: Reviewed and stable  Last Vitals:  Vitals Value Taken Time  BP    Temp    Pulse    Resp    SpO2      Last Pain:  Vitals:   01/27/20 0733  TempSrc: Oral  PainSc: 0-No pain         Complications: No complications documented.

## 2020-01-27 NOTE — Anesthesia Postprocedure Evaluation (Signed)
Anesthesia Post Note  Patient: SILVA AAMODT  Procedure(s) Performed: ESOPHAGOGASTRODUODENOSCOPY (EGD) WITH PROPOFOL (N/A ) SUBMUCOSAL INJECTION     Patient location during evaluation: PACU Anesthesia Type: MAC Level of consciousness: awake and alert Pain management: pain level controlled Vital Signs Assessment: post-procedure vital signs reviewed and stable Respiratory status: spontaneous breathing, nonlabored ventilation, respiratory function stable and patient connected to nasal cannula oxygen Cardiovascular status: stable and blood pressure returned to baseline Postop Assessment: no apparent nausea or vomiting Anesthetic complications: no   No complications documented.  Last Vitals:  Vitals:   01/27/20 0908 01/27/20 0920  BP: 130/79 (!) 124/99  Pulse: 73 71  Resp: 19 16  Temp: 36.7 C   SpO2: 100% 100%    Last Pain:  Vitals:   01/27/20 0920  TempSrc:   PainSc: 0-No pain                 Zaria Taha S

## 2020-01-27 NOTE — Anesthesia Preprocedure Evaluation (Addendum)
Anesthesia Evaluation  Patient identified by MRN, date of birth, ID band Patient awake    Reviewed: Allergy & Precautions, NPO status , Patient's Chart, lab work & pertinent test results  Airway Mallampati: II  TM Distance: >3 FB Neck ROM: Full    Dental no notable dental hx.    Pulmonary neg pulmonary ROS,    Pulmonary exam normal breath sounds clear to auscultation       Cardiovascular hypertension, + Peripheral Vascular Disease  Normal cardiovascular exam Rhythm:Regular Rate:Normal     Neuro/Psych Dementia negative neurological ROS     GI/Hepatic negative GI ROS, Neg liver ROS,   Endo/Other  negative endocrine ROS  Renal/GU negative Renal ROS  negative genitourinary   Musculoskeletal negative musculoskeletal ROS (+)   Abdominal   Peds negative pediatric ROS (+)  Hematology negative hematology ROS (+)   Anesthesia Other Findings   Reproductive/Obstetrics negative OB ROS                             Anesthesia Physical Anesthesia Plan  ASA: III  Anesthesia Plan: MAC   Post-op Pain Management:    Induction: Intravenous  PONV Risk Score and Plan: 0  Airway Management Planned: Simple Face Mask  Additional Equipment:   Intra-op Plan:   Post-operative Plan:   Informed Consent: I have reviewed the patients History and Physical, chart, labs and discussed the procedure including the risks, benefits and alternatives for the proposed anesthesia with the patient or authorized representative who has indicated his/her understanding and acceptance.     Dental advisory given  Plan Discussed with: CRNA and Surgeon  Anesthesia Plan Comments:         Anesthesia Quick Evaluation

## 2020-01-27 NOTE — Discharge Instructions (Signed)
YOU HAD AN ENDOSCOPIC PROCEDURE TODAY: Refer to the procedure report and other information in the discharge instructions given to you for any specific questions about what was found during the examination. If this information does not answer your questions, please call [facility] office at [phone] to clarify.   YOU SHOULD EXPECT: Some feelings of bloating in the abdomen. Passage of more gas than usual. Walking can help get rid of the air that was put into your GI tract during the procedure and reduce the bloating. If you had a lower endoscopy (such as a colonoscopy or flexible sigmoidoscopy) you may notice spotting of blood in your stool or on the toilet paper. Some abdominal soreness may be present for a day or two, also.  DIET: Begin with clear liquid diet today. Then advance your diet as tolerated to your usual at home diet.  Drink plenty of fluids but you should avoid alcoholic beverages for 24 hours.     ACTIVITY: Your care partner should take you home directly after the procedure. You should plan to take it easy, moving slowly for the rest of the day. You can resume normal activity the day after the procedure however YOU SHOULD NOT DRIVE, use power tools, machinery or perform tasks that involve climbing or major physical exertion for 24 hours (because of the sedation medicines used during the test).   SYMPTOMS TO REPORT IMMEDIATELY: A gastroenterologist can be reached at any hour. Please call [phone]  for any of the following symptoms:   Following upper endoscopy (EGD, EUS, ERCP, esophageal dilation) Vomiting of blood or coffee ground material  New, significant abdominal pain  New, significant chest pain or pain under the shoulder blades  Painful or persistently difficult swallowing  New shortness of breath  Black, tarry-looking or red, bloody stools  FOLLOW UP:  If any biopsies were taken you will be contacted by phone or by letter within the next 1-3 weeks. Call [phone]  if  you have not heard about the biopsies in 3 weeks.  Please also call with any specific questions about appointments or follow up tests. Start with clear liquid diet and call if question or problem otherwise slowly advance to her usual at home diet and follow-up with me as needed and consider in the future repeat Botox versus [facility] referral for consideration of diverticular removal

## 2020-01-27 NOTE — Progress Notes (Signed)
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

## 2020-01-27 NOTE — Anesthesia Procedure Notes (Signed)
Date/Time: 01/27/2020 8:37 AM Performed by: Thornell Mule, CRNA Oxygen Delivery Method: Simple face mask

## 2020-01-27 NOTE — Op Note (Signed)
[facility] Patient Name: Amanda Hurley Procedure Date: 01/27/2020 MRN: [id number] Attending MD: Vida Rigger , MD Date of Birth: 02-12-1929 CSN: [id number] Age: 86 Admit Type: Outpatient Procedure:                Upper GI endoscopy Indications:              Dysphagia history of distal esophageal diverticulum                            helped by Botox in the past Providers:                Vida Rigger, MD, Dwain Sarna, RN, Beryle Beams,                            Technician, Roni Bread, CRNA Referring MD:              Medicines:                Propofol total dose 110 mg IV Complications:            No immediate complications. Estimated Blood Loss:     Estimated blood loss: none. Procedure:                Pre-Anesthesia Assessment:                           - Prior to the procedure, a History and Physical                            was performed, and patient medications and                            allergies were reviewed. The patient's tolerance of                            previous anesthesia was also reviewed. The risks                            and benefits of the procedure and the sedation                            options and risks were discussed with the patient.                            All questions were answered, and informed consent                            was obtained. Prior Anticoagulants: The patient has                            taken no previous anticoagulant or antiplatelet                            agents. ASA Grade Assessment: III - A patient with  severe systemic disease. After reviewing the risks                            and benefits, the patient was deemed in                            satisfactory condition to undergo the procedure.                           After obtaining informed consent, the endoscope was                            passed under direct vision. Throughout the                             procedure, the patient's blood pressure, pulse, and                            oxygen saturations were monitored continuously. The                            GIF-H190 ([id number]) Olympus gastroscope was                            introduced through the mouth, and advanced to the                            second part of duodenum. The upper GI endoscopy was                            accomplished without difficulty. The patient                            tolerated the procedure well. Scope In: Scope Out: Findings:      A non-bleeding diverticulum with a large opening and no stigmata of       recent bleeding was found in the lower third of the esophagus. Lavage of       the area was performed using a small amount of sterile water, resulting       in clearance with excellent visualization.      A medium-sized hiatal hernia was present.      One benign-appearing, intrinsic mild stenosis was found. The stenosis       was traversed and dilated with minimal trauma with the scope.      A hypertonic lower esophageal sphincter was found. There was mild       resistance to endoscope advancement into the stomach. The       gastroesophageal junction and cardia were normal on retroflexed view.       Area was successfully injected with 100 units botulinum toxin in the       usual fashion with initial excellent results please see picture #14 with       post procedure relaxation.      Localized mild inflammation characterized by congestion (edema),       erythema and granularity was found on the greater curvature of the  stomach.      The duodenal bulb, first portion of the duodenum and second portion of       the duodenum were normal.      The exam was otherwise without abnormality.      Food was found in the middle third of the esophagus and in the lower       third of the esophagus. Lavage of the area was performed using a small       amount of sterile water, resulting in clearance with adequate        visualization. Impression:               - Diverticulum in the lower third of the esophagus.                           - Medium-sized hiatal hernia.                           - Benign-appearing esophageal stenosis.                           - The esophageal examination was consistent with                            achalasia. Injected with botulinum toxin.                           - Atrophic gastritis.                           - Normal duodenal bulb, first portion of the                            duodenum and second portion of the duodenum.                           - The examination was otherwise normal.                           - Food in the middle third of the esophagus and in                            the lower third of the esophagus.                           - No specimens collected. Moderate Sedation:      Not Applicable - Patient had care per Anesthesia. Recommendation:           - Advance diet as tolerated to her usual at home                            diet however begin with clear liquid diet today.                           - Continue present medications.                           -  Return to GI clinic PRN.                           - Telephone GI clinic if symptomatic PRN.                            Retreatment as needed versus consideration of                            university referral for diverticular resection Procedure Code(s):        --- Professional ---                           [id number], Esophagogastroduodenoscopy, flexible,                            transoral; with directed submucosal injection(s),                            any substance Diagnosis Code(s):        --- Professional ---                           Q39.6, Congenital diverticulum of esophagus                           K44.9, Diaphragmatic hernia without obstruction or                            gangrene                           K22.2, Esophageal obstruction                            K29.40, Chronic atrophic gastritis without bleeding                           R13.10, Dysphagia, unspecified CPT copyright 2019 American Medical Association. All rights reserved. The codes documented in this report are preliminary and upon coder review may  be revised to meet current compliance requirements. Vida Rigger, MD 01/27/2020 9:08:56 AM This report has been signed electronically. Number of Addenda: 0

## 2020-01-28 ENCOUNTER — Encounter (HOSPITAL_COMMUNITY): Payer: Self-pay | Admitting: Gastroenterology

## 2021-06-02 ENCOUNTER — Telehealth: Payer: Self-pay

## 2021-06-02 NOTE — Telephone Encounter (Signed)
Attempted to contact patient's daughter Angelique Blonder to schedule a Palliative Care consult appointment. No answer left a message to return call.

## 2021-06-03 ENCOUNTER — Telehealth: Payer: Self-pay

## 2021-06-03 NOTE — Telephone Encounter (Signed)
Attempted to contact patient's daughter Angelique Blonder to schedule a Palliative Care consult appointment. No answer and voicemail is full.

## 2021-06-03 NOTE — Telephone Encounter (Signed)
  Spoke with patient's daughter Angelique Blonder and scheduled an in-person Palliative Consult for 06/07/21 @ 8:30 AM with Dr. Bufford Spikes. Documentation will be noted in [facility]'s EMR [employer].   Daughter states patient had a fall this AM and is in pain. She requested a early appointment. 11/8 was the soonest available.   COVID screening was negative. No pets in home. Patient lives with daughter.  Consent obtained; updated Outlook/Netsmart/Team List and Epic.   Family is aware they may be receiving a call from provider the day before or day of to confirm appointment.

## 2021-06-13 ENCOUNTER — Telehealth: Payer: Self-pay

## 2021-06-13 NOTE — Telephone Encounter (Signed)
Received message to call patient's daughter concerning new wound on buttocks. Message left for daughter, offering visit for 06/14/21.

## 2021-08-29 ENCOUNTER — Telehealth: Payer: Self-pay | Admitting: Internal Medicine

## 2021-08-29 NOTE — Telephone Encounter (Signed)
Amanda Hurley called to update me (I'm following Amanda Hurley for palliative care)--Amanda Hurley was able to take liquids and banana, soft items until over the past two days.  She has now been spitting back up everything she swallows including very small cut up watermelon, pudding with high protein and yogurt.  She could not keep down boost this am either.  15 mins went by and she started to gradually spit everything out which lasted a long time (not just all at once).    Amanda Hurley has called Dr. Perley Jain office and he's the hospital doctor this week.  There was question about a modified barium swallow.   Pt does have progressive dementia.  She does not want a feeding tube.  I'm concerned that she will not be able to participate in a test or tolerate a procedure to fix any type of stricture of diverticulum causing this.  She has been having gradually increased difficulty with her dysphagia and has been losing weight.  She's remained mobile with her walker.  She is talkative but lives in a different time when her parents were still alive.

## 2021-09-02 ENCOUNTER — Telehealth (HOSPITAL_COMMUNITY): Payer: Self-pay

## 2021-09-02 NOTE — Telephone Encounter (Signed)
Attempted to contact patient to schedule OP MBS - left voicemail. ?

## 2021-09-05 ENCOUNTER — Other Ambulatory Visit (HOSPITAL_COMMUNITY): Payer: Self-pay

## 2021-09-05 DIAGNOSIS — R1310 Dysphagia, unspecified: Secondary | ICD-10-CM

## 2021-09-15 ENCOUNTER — Ambulatory Visit (HOSPITAL_COMMUNITY)
Admission: RE | Admit: 2021-09-15 | Discharge: 2021-09-15 | Disposition: A | Discharge status: Home / Self Care | Payer: Medicare Other | Source: Ambulatory Visit | Attending: Physician Assistant | Admitting: Physician Assistant

## 2021-09-15 ENCOUNTER — Ambulatory Visit (HOSPITAL_COMMUNITY): Payer: Medicare Other

## 2021-09-15 ENCOUNTER — Other Ambulatory Visit: Payer: Self-pay

## 2021-09-15 ENCOUNTER — Encounter (HOSPITAL_COMMUNITY): Payer: Medicare Other

## 2021-09-15 ENCOUNTER — Encounter (HOSPITAL_COMMUNITY): Payer: Self-pay

## 2021-09-15 DIAGNOSIS — R1310 Dysphagia, unspecified: Secondary | ICD-10-CM | POA: Insufficient documentation

## 2021-09-20 ENCOUNTER — Other Ambulatory Visit: Payer: Medicare Other | Admitting: Internal Medicine

## 2021-09-20 ENCOUNTER — Other Ambulatory Visit: Payer: Self-pay

## 2021-09-20 DIAGNOSIS — M545 Low back pain, unspecified: Secondary | ICD-10-CM

## 2021-09-20 DIAGNOSIS — Q396 Congenital diverticulum of esophagus: Secondary | ICD-10-CM | POA: Insufficient documentation

## 2021-09-20 DIAGNOSIS — K224 Dyskinesia of esophagus: Secondary | ICD-10-CM

## 2021-09-20 DIAGNOSIS — F03918 Unspecified dementia, unspecified severity, with other behavioral disturbance: Secondary | ICD-10-CM

## 2021-09-20 DIAGNOSIS — G8929 Other chronic pain: Secondary | ICD-10-CM

## 2021-09-20 DIAGNOSIS — R634 Abnormal weight loss: Secondary | ICD-10-CM | POA: Insufficient documentation

## 2021-09-20 NOTE — Patient Instructions (Signed)
Try V8 juice Try stage 1 baby foods

## 2021-09-20 NOTE — Progress Notes (Signed)
[facility] Visit Telephone: [phone]  Fax: [phone]   Date of encounter: 09/20/21 8:48 AM PATIENT NAME: Amanda Hurley [address] [place] [address]   [phone] (home)  DOB: Aug 16, 1928 MRN: [id number] PRIMARY CARE PROVIDER:    Burnard Bunting, MD,  [place] [address] [phone]  REFERRING PROVIDER:   Burnard Bunting, [place],  [place] [address] [phone]  RESPONSIBLE PARTY:    Contact Information     Name Relation Home Work [place] Daughter [phone]  [phone]   Orah, Sonnen [phone]  [phone]        I met face to face with patient and family in her home. Palliative Care was asked to follow this patient by consultation request of  Burnard Bunting, MD to address advance care planning and complex medical decision making. This is follow-up visit.                                     ASSESSMENT AND PLAN / RECOMMENDATIONS:   BILLABLE ICD-10:    Dementia without behavioral disturbance: F03.90   Esophageal dysphagia: R13.14   Low back pain   Weight loss: R63.4   Palliative Care Visit: Z51.5   PPS: 40%   HOSPICE ELIGIBILITY/DIAGNOSIS: TBD   I met face to face with patient and family. Palliative Care was asked to follow this patient by consultation request of Dr. Nada Libman to address advance care planning, goals of care, and symptom management. This is a follow up visit.   ASSESSMENT AND PLAN / RECOMMENDATIONS:    Advance Care Planning/Goals of Care: Goals include to maximize quality of life and symptom management. Our advance care planning conversation included a discussion about: ??   Decision not to resuscitate or to de-escalate disease focused treatments due to poor prognosis.    27 minutes were spent on ACP including education about dementia progression, comfort feeding, suggestions to modify diet,  risks of procedures in view of her advanced dementia, frailty.  CODE STATUS: DNR   Amanda Hurley. ?The palliative care team will continue to follow for complex medical decision making, advance care planning, and clarification of goals.  Return 4 weeks or prn. Please call our office at [phone] if we can be of additional assistance.    This visit was coded based on medical decision making (MDM). 27 mins on ACP.  Symptom Management/Plan:   Dementia: progressing with evidence of increased dysphagia - continues to hold/pocket and increased episodes of coughing and regurgitation with pureed foods and liquids to the point where she cannot eat/drink anything at times.    Dysphagia: modified barium swallow showed low risk for aspiration, however, esophageal dysphagia remains an issue for adequate intake and patient has not been able to tolerate her usual foods/beverages. Family reintroducing applesauce, milkshakes/smoothies, and Boost. Will also try baby foods and V8 juice. Reinforced aspiration precautions. Follow up with Dr. Watt Climes (GI) on 09/29/21.   Low back pain: continues to improve. Family was able to stop Fentanyl patch and transition to Aspercream roll-on and liquid Tylenol PRN for effective pain management.   Weight loss: continues with another 2 lb loss since last visit (114 on 08/18/21 and 112 today). Discussed adding a variety of flavors and nutritional content with baby food purees and V8 juice. Encouraged small frequent meals and continuing protein supplements.  PPS: 40%  HOSPICE ELIGIBILITY/DIAGNOSIS: TBD  Chief Complaint: Follow-up palliative visit  HISTORY OF PRESENT ILLNESS:  Amanda Hurley is a 88 y.o. year old female  with a past medical history significant for dementia without behaviors, insomnia, anxiety, AAA, HTN, HLD, abnormal weight loss, esophageal diverticulum (s/p Botox 12/2019), GRED, hiatal hernia, recurrent UTIs, and falls. Patient is seen today for palliative care  follow up visit to discuss symptom management and review goals of care. She is alert and pleasantly confused but able to give appropriate, short verbal responses. Continues to talk about visiting her parents. Patient denies having any pain and daughter reports low back pain has remained well managed since stopping Fentanyl patch, using Aspercream roll-on and liquid Tylenol PRN only. Patient began having increased difficulty swallowing pureed foods a few weeks ago with increased reflux making it difficult to keep any foods or even full liquids down. Modified barium swallow study was completed on 09/15/21 and results showed low risk for aspiration and esophageal dysphagia only. Recommendations were made to continue aspiration precautions and offer crushed/pureed and full liquid diet as tolerated. Daughter states that Amanda Hurley has been doing well with Boost, applesauce, and milkshakes or smoothies since MBS completed. Plans to follow up with Dr. Watt Climes (GI) on 09/29/21 to discuss any other possible treatment options. Daughter mentions that caregivers have observed finding a small dark spot one time in incontinent brief - suspect this is hemorrhoid irritation with firm stools. Daughter reports healed pressure injury on left buttock remains closed - using Aquaphor as barrier & may also try zinc oxide to manage. Goals remain for patient to be home with family, safe and comfortable. Daughter reports patient will no longer be getting blood draws or injections.     Last weight was 111 lbs on 2/16.  She is 112.6 lbs today at home.  Drank the barium liquid with sugar added and it "went right down", then had pudding she did not like but one bite went down.  No findings on oropharyngeal barium swallow. Tried applesauce which went down ok at home since.  Considering another esophageal study with GI.  She's not having the vomiting up she was having with each bite for a while.  GI appt is 3/2 with Dr. Watt Climes.  Doing all liquids.   Tomato soup has stayed down--oyster crackers did not work.  Stronger flavors were suggested due to holding food in the mouth.  Likes activia liquid yogurt.  She's had a few mornings of pain.  Rolling aspercreme on her back.  Also getting liquid tylenol for pain--not even daily.  Had some side pain also at one point.    Has some dark spots in depend in am--circle of dark--could be blood or stool.  Not a smear.  No blood seen when applying preparation H.  Opted not to draw blood or flu shot anymore due to pt screaming.  Had first two covid vaccines and opted not to get boosters.  History obtained from review of EMR, discussion with primary team, and interview with family, facility staff/caregiver and/or Amanda Hurley.   I reviewed available labs, medications, imaging, studies and related documents from the EMR.  Records reviewed and summarized above.   ROS  General: NAD EYES: denies vision changes ENMT: has dysphagia Cardiovascular: denies chest pain, denies DOE Pulmonary: denies cough, denies increased SOB Abdomen: endorses small appetite--taking liquid and pureed diet, denies constipation--BMs qod, has hemorrhoid, endorses incontinence of bowel on occasion GU: denies dysuria, endorses incontinence of urine MSK:  has increased weakness,  no falls reported Skin: denies rashes or wounds Neurological: denies pain-rare back pain--not this am, denies insomnia Psych: Endorses positive mood Heme/lymph/immuno: denies bruises, abnormal bleeding  Physical Exam: Current and past weights:  112 lbs down from 114lbs last visit Constitutional: NAD General: frail appearing, thin EYES: anicteric sclera, lids intact, no discharge  ENMT: intact hearing, oral mucous membranes moist, dentition intact CV: S1S2, RRR, no LE edema Pulmonary: few rhonchi R>L upper lung fields, no increased work of breathing, occasional cough, room air Abdomen: intake 100%, normo-active BS + 4 quadrants, soft and non tender, no  ascites GU: deferred MSK: no sarcopenia, moves all extremities, ambulatory Skin: warm and dry, no rashes or wounds on visible skin Neuro:  no generalized weakness,  no cognitive impairment Psych: non-anxious affect, A and O x 3 Hem/lymph/immuno: no widespread bruising  CURRENT PROBLEM LIST:  Patient Active Problem List   Diagnosis Date Noted   Dementia with behavioral disturbance 04/10/2019   Unexplained night sweats 12/23/2016   Abnormal CXR 12/22/2016   PAST MEDICAL HISTORY:  Active Ambulatory Problems    Diagnosis Date Noted   Abnormal CXR 12/22/2016   Unexplained night sweats 12/23/2016   Dementia with behavioral disturbance 04/10/2019   Resolved Ambulatory Problems    Diagnosis Date Noted   No Resolved Ambulatory Problems   Past Medical History:  Diagnosis Date   AAA (abdominal aortic aneurysm) (HCC)    Dementia (HCC)    Dysphagia    Hyperlipidemia    Hypertension    SOCIAL HX:  Social History   Tobacco Use   Smoking status: Never   Smokeless tobacco: Never  Substance Use Topics   Alcohol use: No    Alcohol/week: 0.0 standard drinks     ALLERGIES: No Known Allergies   PERTINENT MEDICATIONS:  Outpatient Encounter Medications as of 09/20/2021  Medication Sig   memantine (NAMENDA) 5 MG tablet Take 1 tablet (5 mg total) by mouth 2 (two) times daily. (Patient not taking: Reported on 01/16/2020)   SULFATRIM PEDIATRIC 200-40 MG/5ML suspension Take 10 mLs by mouth daily.   No facility-administered encounter medications on file as of 09/20/2021.   Thank you for the opportunity to participate in the care of Amanda Hurley.  The palliative care team will continue to follow. Please call our office at [phone] if we can be of additional assistance.   Hollace Kinnier, DO  COVID-19 PATIENT SCREENING TOOL Asked and negative response unless otherwise noted:  Have you had symptoms of covid, tested positive or been in contact with someone with symptoms/positive test in the  past 5-10 days? no

## 2021-09-23 ENCOUNTER — Encounter: Payer: Self-pay | Admitting: Internal Medicine

## 2021-10-11 ENCOUNTER — Encounter: Payer: Self-pay | Admitting: Internal Medicine

## 2021-10-11 ENCOUNTER — Other Ambulatory Visit: Payer: Self-pay

## 2021-10-11 ENCOUNTER — Other Ambulatory Visit: Payer: Medicare Other | Admitting: Internal Medicine

## 2021-10-11 VITALS — BP 120/74 | Resp 16 | Wt 118.0 lb

## 2021-10-11 DIAGNOSIS — R443 Hallucinations, unspecified: Secondary | ICD-10-CM | POA: Insufficient documentation

## 2021-10-11 DIAGNOSIS — F03918 Unspecified dementia, unspecified severity, with other behavioral disturbance: Secondary | ICD-10-CM

## 2021-10-11 DIAGNOSIS — I10 Essential (primary) hypertension: Secondary | ICD-10-CM | POA: Insufficient documentation

## 2021-10-11 DIAGNOSIS — K224 Dyskinesia of esophagus: Secondary | ICD-10-CM

## 2021-10-11 DIAGNOSIS — G4700 Insomnia, unspecified: Secondary | ICD-10-CM | POA: Insufficient documentation

## 2021-10-11 DIAGNOSIS — R634 Abnormal weight loss: Secondary | ICD-10-CM

## 2021-10-11 NOTE — Progress Notes (Signed)
? ? ?[facility] ?Community Palliative Care Follow-Up Visit ?Telephone: [phone]  ?Fax: [phone]  ? ?Date of encounter: 10/11/21 ?6:58 AM ?PATIENT NAME: Amanda [place] ?[place] [address]   ?[phone] (home)  ?DOB: January 17, 1929 ?MRN: [id number] ?PRIMARY CARE PROVIDER:    ?Burnard Bunting, MD,  ?[address] ?[place] [address] ?[phone] ? ?REFERRING PROVIDER:   ?Burnard Bunting, MD ?[address] ?[place],  [place] [address] ?[phone] ? ?RESPONSIBLE PARTY:    ?Contact Information   ? ? Name Relation Home Work Mobile  ? Clark,Denise Daughter [phone]  [phone]  ? Peace,Bill Son [phone]  [phone]  ? ?  ? ? ? ?I met face to face with patient and family in her home. Palliative Care was asked to follow this patient by consultation request of  Burnard Bunting, MD to address advance care planning and complex medical decision making. This is follow-up visit.  ? ? ?                                 ASSESSMENT AND PLAN / RECOMMENDATIONS:  ? ?Advance Care Planning/Goals of Care: Goals include to maximize quality of life and symptom management. Patient/health care surrogate gave his/her permission to discuss.Our advance care planning conversation included a discussion about:    ?Decision not to resuscitate or to de-escalate disease focused treatments due to poor prognosis. ?CODE STATUS:  MOST on file ?Goal to stay at home for care, avoid hospitalizations and invasive procedures, blood draws and further sticks of any sort ? ?Symptom Management/Plan: ?1. Esophageal dysmotility ?-continue smoothies, yogurt and baby food pouches, gatorade and flavored water ?-cont boost high protein with occasional weight gain boost between or for shakes ?-discussion held b/w family and Dr. Perley Jain office was to continue to monitor her dysphagia and aspiration without invasive interventions at this time after MBS was not particularly helpful ? ?2. Dementia with behavioral  disturbance ?-pt always discusses going home at visits and is only oriented to familiar faces, believes her parents are alive, but generally pleasant, ambulatory with walker but does need assistance with ADLs other than toileting herself, family and caregivers prepare her modified diet and administer meds ? ?3. Abnormal weight loss ?-has gained a couple of lbs but also wearing much more clothing today ?-monitor weights and intake ongoing ?-continue supplement boost high protein and occasional weight gain version with more carbs and fat (every other day or so), cont protein in some smoothies ?-family not ready for hospice at this time, but goals are primarily comfort-based ? ?Follow up Palliative Care Visit: Palliative care will continue to follow for complex medical decision making, advance care planning, and clarification of goals. Return 4-6 weeks or prn. ? ?This visit was coded based on medical decision making (MDM). ? ?PPS: 40% ? ?HOSPICE ELIGIBILITY/DIAGNOSIS:  Abnormal weight loss/yes but family not ready ? ?Chief Complaint: Follow-up palliative visit ? ?HISTORY OF PRESENT ILLNESS:  Amanda Hurley is a 88 y.o. year old female  with dementia without behaviors, insomnia, anxiety, AAA, htn, hyperlipidemia, abnormal weight loss, esophageal diverticulum with prior botox in 12/2019, gerd, hiatal hernia, recurrent UTIs, and prior fall with compression fx seen for palliative care f/u.  ? ?Avari's modified barium swallow was pretty unremarkable.  She has only tolerated yogurt with some added items, some of the baby food pouches, also adding ice cream to yogurt, chocolate syrup, smoothies.  Attempts at pureeing other foods have  not worked.  Boost up to 4 per day.  She had a f/u video visit with her PCP, Dr. Reynaldo Minium.  They've tried both the high protein boost and the weight gain boost.   ? ?No open area on bottom at present.   ? ?Able to get up at night to urinate on her own.  Does not call whichever child is with her.   Eventually, will need bedside commode but pt likes privacy and warmth in restroom.   ? ?Using the preparation H on her hemorrhoids.  No recent bleeding seen in depends. ? ?History obtained from review of EMR, discussion with primary team, and interview with family, facility staff/caregiver and/or Ms. Fusco.  ? ?I reviewed available labs, medications, imaging, studies and related documents from the EMR.  Records reviewed and summarized above.  ? ?ROS ?General: NAD ?EYES: denies vision changes ?ENMT: has dysphagia ?Cardiovascular: denies chest pain, denies DOE ?Pulmonary: denies cough, denies increased SOB ?Abdomen: endorses small appetite--taking liquid and pureed diet, denies constipation--BMs qod, has hemorrhoid, endorses incontinence of bowel on occasion ?GU: denies dysuria, endorses incontinence of urine ?MSK:  has increased weakness,  no falls reported ?Skin: denies rashes or wounds ?Neurological: denies pain-rare back pain--not this am, denies insomnia ?Psych: Endorses positive mood ?Heme/lymph/immuno: denies bruises, abnormal bleeding ? ?Physical Exam: ?Current and past weights: today 118 lbs with pjs, vest, fuzzy robe and sneakers, last visit was 112 lbs (feb) with flannel shirt and cotton pants, shoes down from 114 lbs a month before  ?Constitutional: NAD ?General: frail appearing, thin ?EYES: anicteric sclera, lids intact, no discharge  ?ENMT: intact hearing, oral mucous membranes moist, dentition intact ?CV: S1S2, RRR, no LE edema ?Pulmonary: CTA, no increased work of breathing, occasional cough, room air ?Abdomen: intake is good of boost and her pureed items, normo-active BS + 4 quadrants, soft and non tender, no ascites ?GU: deferred ?MSK: no sarcopenia, moves all extremities, ambulatory ?Skin: warm and dry, no rashes or wounds on visible skin ?Neuro:  no generalized weakness, cognitive impairment--repeats self and believes parents are alive ?Psych: non-anxious affect, A and O to familiar people  only ?Hem/lymph/immuno: no widespread bruising ? ?CURRENT PROBLEM LIST:  ?Patient Active Problem List  ? Diagnosis Date Noted  ? Esophageal dysmotility 09/20/2021  ? Esophageal diverticulum 09/20/2021  ? Abnormal weight loss 09/20/2021  ? Dementia with behavioral disturbance 04/10/2019  ? Unexplained night sweats 12/23/2016  ? Abnormal CXR 12/22/2016  ? ?PAST MEDICAL HISTORY:  ?Active Ambulatory Problems  ?  Diagnosis Date Noted  ? Abnormal CXR 12/22/2016  ? Unexplained night sweats 12/23/2016  ? Dementia with behavioral disturbance 04/10/2019  ? Esophageal dysmotility 09/20/2021  ? Esophageal diverticulum 09/20/2021  ? Abnormal weight loss 09/20/2021  ? ?Resolved Ambulatory Problems  ?  Diagnosis Date Noted  ? No Resolved Ambulatory Problems  ? ?Past Medical History:  ?Diagnosis Date  ? AAA (abdominal aortic aneurysm)   ? Dementia ([facility])   ? Dysphagia   ? Hyperlipidemia   ? Hypertension   ? ?SOCIAL HX:  ?Social History  ? ?Tobacco Use  ? Smoking status: Never  ? Smokeless tobacco: Never  ?Substance Use Topics  ? Alcohol use: No  ?  Alcohol/week: 0.0 standard drinks  ? ?ALLERGIES: No Known Allergies   ? ?PERTINENT MEDICATIONS: tylenol liquid prn ?Outpatient Encounter Medications as of 10/11/2021  ?Medication Sig  ? carbamide peroxide (DEBROX) 6.5 % OTIC solution 3-4 drops  ? cephALEXin (KEFLEX) 125 MG/5ML suspension Take by mouth.  ? ?No facility-administered  encounter medications on file as of 10/11/2021.  ? ?Thank you for the opportunity to participate in the care of Ms. Boehlke.  The palliative care team will continue to follow. Please call our office at [phone] if we can be of additional assistance.  ? ?Hollace Kinnier, DO ? ?COVID-19 PATIENT SCREENING TOOL ?Asked and negative response unless otherwise noted: ? ?Have you had symptoms of covid, tested positive or been in contact with someone with symptoms/positive test in the past 5-10 days? no ? ?

## 2021-11-15 ENCOUNTER — Other Ambulatory Visit: Payer: Medicare Other | Admitting: Internal Medicine

## 2021-11-15 ENCOUNTER — Encounter: Payer: Self-pay | Admitting: Internal Medicine

## 2021-11-15 VITALS — BP 124/50 | HR 67 | Resp 16 | Wt 116.6 lb

## 2021-11-15 DIAGNOSIS — Z515 Encounter for palliative care: Secondary | ICD-10-CM

## 2021-11-15 DIAGNOSIS — K224 Dyskinesia of esophagus: Secondary | ICD-10-CM

## 2021-11-15 DIAGNOSIS — R443 Hallucinations, unspecified: Secondary | ICD-10-CM

## 2021-11-15 DIAGNOSIS — F03918 Unspecified dementia, unspecified severity, with other behavioral disturbance: Secondary | ICD-10-CM

## 2021-11-15 DIAGNOSIS — R634 Abnormal weight loss: Secondary | ICD-10-CM

## 2021-11-15 NOTE — Progress Notes (Signed)
Amanda Hurley Visit Telephone: [phone]  Fax: [phone]   Date of encounter: 11/15/21 8:49 AM PATIENT NAME: Amanda Hurley [address] [place] [address]   [phone] (home)  DOB: Apr 02, 1929 MRN: [id number] PRIMARY CARE PROVIDER:    Burnard Bunting, MD,  [place] [address] [phone]  REFERRING PROVIDER:   Burnard Hurley, [address] [place],  [place] [address] [phone]  RESPONSIBLE PARTY:    Contact Information     Name Relation Home Work [place] Daughter [phone]  [phone]   Amanda, Hurley [phone]  [phone]        I met face to face with patient and family in her home. Palliative Care was asked to follow this patient by consultation request of  Amanda Bunting, MD to address advance care planning and complex medical decision making. This is follow-up visit. Met with pt and daughter, Amanda Hurley.                                    ASSESSMENT AND PLAN / RECOMMENDATIONS:   Advance Care Planning/Goals of Care: Goals include to maximize quality of life and symptom management. Patient/health care surrogate gave his/her permission to discuss.Our advance care planning conversation included a discussion about:    The value and importance of advance care planning  Experiences with loved ones who have been seriously ill or have died  Exploration of personal, cultural or spiritual beliefs that might influence medical decisions  Exploration of goals of care in the event of a sudden injury or illness  Identification  of a healthcare agent  Review and updating or creation of an  advance directive document . Decision not to resuscitate or to de-escalate disease focused treatments due to poor prognosis. CODE STATUS:  MOST in place Goal to stay at home long-term for care and three children plus comfort keepers caregivers provide her care on a rotating  basis--she is never alone  Symptom Management/Plan: 1. Esophageal dysmotility -ongoing dysphagia, but family and caregivers have reintroduced mashed potatoes, some baby food (but some resistance if seeds present as pt paranoid that these are ants), 3-4 boosts per day, smoothies -only one episode of certain aspiration in the past, also at risk for esophageal impaction -cont pureed diet   2. Dementia with behavioral disturbance ([place]) -oriented only to familiar people, not place or time, requires help with bathing dressing meals -can drink on her own when provided and rarely will feed herself something like mashed potatoes -remains ambulatory with her walker and, unfortunately, sometimes w/o when she forgets it -able to toilet herself at night again since pain in back from compression fx resolved -dysphagia is her biggest issue and limitation  3. Abnormal weight loss -Is down 1.4 lbs from last visit -Intake remains limited to pureed foods and boost but family and caregivers offer often and pt will take though she might only take 50% of the pureed foods sometimes -continue to monitor weight  4. Hallucinations -remain, but have not resulted in any difficulties with wandering at night or frightened patient -cont to monitor  5. Palliative care by specialist -not much difference vs last visit outside from slight weight loss -continue to see every 6 weeks or so to monitor for changes and hospice need, new symptoms or concerns   Follow up Palliative Care Visit: Palliative care will continue to follow for  complex medical decision making, advance care planning, and clarification of goals. Return 01/03/2022 and prn.  This visit was coded based on medical decision making (MDM).  PPS: 40%  HOSPICE ELIGIBILITY/DIAGNOSIS:  Eligible but goals not currently consistent/Abnormal weight loss, AD  Chief Complaint: Follow-up palliative visit  HISTORY OF PRESENT ILLNESS:  Amanda Hurley is a 88 y.o. year  old female  with Alzheimer's disease disease with hallucinations, esophageal dysphagia with diverticulum, pressure injury to buttocks, prior fall with compression fracture due to osteoporosis seen for palliative care follow-up at home.    Has been to dentist--no new concerns.  Just trying to keep the teeth she has and be sure she does not have any looming infections.    No recent spells of choking.  Thinks any of the baby food pouches that have seeds look like ants and that she's being given ants.  They've been able to introduce mashed potatoes, tomato soup, tried cream of chicken but it was not liked.  She ate half of a serving of mashed potatoes at lunch yesterday.    She reports not being hungry.  She does like boost and finished one while I was here.  Amanda Hurley got her a second.  She will have 3-4 boosts per day.  Also having smoothies.    She says she has no pain.  Sleeping well.  Amanda Hurley had trouble getting her to wake up this am--she had to talk loudly and grab her toes.    She still knows her children.    Amanda Hurley and her husband brought her to the old farmhouse--she walked up the drive with her walker.  She was able to climb the stairs.  She talked about the idea of a picnic which happened in the past.  It's in [place]--they also rode by her high school.  She is hearing someone telling her to do things.  She is having regular bms almost daily.    Still has a small scab on her buttocks' pressure injury.  Hemorrhoids remain with some blood spots in her depends. Still using preparation H with each toileting.   History obtained from review of EMR, discussion with primary team, and interview with family, facility staff/caregiver and/or Ms. Amanda Hurley.   I reviewed available labs, medications, imaging, studies and related documents from the EMR.  Records reviewed and summarized above.   ROS General: NAD EYES: denies vision changes ENMT: has dysphagia on pureed diet Cardiovascular: denies  chest pain, denies DOE Pulmonary: denies cough, denies increased SOB Abdomen: endorses poor appetite, denies constipation, endorses continence of bowel GU: denies dysuria, endorses some incontinence of urine, but does get up and attempt to use restroom MSK:  denies increased weakness,  no falls reported but does neglect to bring her walker to the bathroom at times overnight when gets up on her own Skin: scab remains on buttock pressure injury but no new skin breakdown Neurological: denies pain, denies insomnia Psych: Endorses positive mood, has hallucinations. Heme/lymph/immuno: denies bruises, abnormal bleeding  Physical Exam: Current and past weights:  118 lbs last visit 09/20/2021 Constitutional: NAD General: frail appearing, thin, sitting up in recliner bundled in multiple layers and blanket EYES: anicteric sclera, lids intact, no discharge  ENMT: intact hearing, oral mucous membranes moist, dentition intact CV: S1S2, RRR, no LE edema Pulmonary: LCTA, no increased work of breathing--pt struggled to follow commands for big breaths today (historically has done this), no cough, room air Abdomen: intake 50% of mashed potatoes, does have 3-4 boosts per  day plus smoothies and occasional soups, normo-active BS + 4 quadrants, soft and non tender, no ascites GU: deferred MSK:has sarcopenia, moves all extremities, ambulatory with rolling walker with skis Skin: warm and dry, no rashes or wounds on visible skin--has remaining pressure injury to buttocks with scab intact Neuro:  has generalized weakness,  has cognitive impairment Psych: non-anxious affect, A and O  to familiar faces--thinks of her original farmhouse home as home so thinks she is not home now; recognizes children by name, actively hallucinating both visual and auditory Hem/lymph/immuno: no widespread bruising  CURRENT PROBLEM LIST:  Patient Active Problem List   Diagnosis Date Noted   Hallucinations 10/11/2021   Hypertension  10/11/2021   Insomnia 10/11/2021   Esophageal dysmotility 09/20/2021   Esophageal diverticulum 09/20/2021   Abnormal weight loss 09/20/2021   Chronic kidney disease, stage 3a ([facility]) 10/21/2019   Fatigue 07/10/2019   Dementia with behavioral disturbance ([facility]) 04/10/2019   Unexplained night sweats 12/23/2016   Abnormal CXR 12/22/2016   PAST MEDICAL HISTORY:  Active Ambulatory Problems    Diagnosis Date Noted   Abnormal CXR 12/22/2016   Unexplained night sweats 12/23/2016   Dementia with behavioral disturbance ([facility]) 04/10/2019   Esophageal dysmotility 09/20/2021   Esophageal diverticulum 09/20/2021   Abnormal weight loss 09/20/2021   Chronic kidney disease, stage 3a ([facility]) 10/21/2019   Fatigue 07/10/2019   Hallucinations 10/11/2021   Hypertension 10/11/2021   Insomnia 10/11/2021   Resolved Ambulatory Problems    Diagnosis Date Noted   No Resolved Ambulatory Problems   Past Medical History:  Diagnosis Date   AAA (abdominal aortic aneurysm)    Dementia ([facility])    Dysphagia    Hyperlipidemia    SOCIAL HX:  Social History   Tobacco Use   Smoking status: Never   Smokeless tobacco: Never  Substance Use Topics   Alcohol use: No    Alcohol/week: 0.0 standard drinks     ALLERGIES: No Known Allergies   PERTINENT MEDICATIONS:  Outpatient Encounter Medications as of 11/15/2021  Medication Sig   carbamide peroxide (DEBROX) 6.5 % OTIC solution 3-4 drops   cephALEXin (KEFLEX) 125 MG/5ML suspension Take by mouth.   No facility-administered encounter medications on file as of 11/15/2021.   Thank you for the opportunity to participate in the care of Ms. Menger.  The palliative care team will continue to follow. Please call our office at [phone] if we can be of additional assistance.   Hollace Kinnier, DO  COVID-19 PATIENT SCREENING TOOL Asked and negative response unless otherwise noted:  Have you had symptoms of covid, tested positive or been in contact with someone with  symptoms/positive test in the past 5-10 days? no

## 2021-11-24 ENCOUNTER — Telehealth: Payer: Self-pay | Admitting: Internal Medicine

## 2021-11-24 DIAGNOSIS — R410 Disorientation, unspecified: Secondary | ICD-10-CM

## 2021-11-24 DIAGNOSIS — N3000 Acute cystitis without hematuria: Secondary | ICD-10-CM

## 2021-11-24 MED ORDER — CIPROFLOXACIN 500 MG/5ML (10%) PO SUSR: 500.0000 mg | Freq: Two times a day (BID) | ORAL | Status: DC

## 2021-11-24 MED ORDER — CIPRO 500 MG/5ML (10%) PO SUSR: 500.0000 mg | Freq: Two times a day (BID) | ORAL | 0 refills | Status: AC

## 2021-11-24 NOTE — Telephone Encounter (Signed)
Angelique Blonder called concerned about her mother possibly having a UTI.  She has not eaten at all today.  She's only had one boost.  She was talking incessantly and trying to go out the door to go home.  Her caregiver was unable to obtain a UA c+s due to pt's disorientation.  She has had very little to drink either.  Last night, pt's other daughter said pt did not sleep and was up and down most of the night, finally going to bed at 6:30 and sleeping until noon.  They suspect UTI.  Explained it could be that or several other things like electrolyte abnormalities, aspiration pneumonia, dehydration or simply further decline from her dementia.  Angelique Blonder is in agreement with a prescription being sent for an antibiotic to see if her mom gets better. She previously took liquid cipro with urology last summer.  We'll try that again this time.   ?

## 2022-01-03 ENCOUNTER — Other Ambulatory Visit: Payer: Medicare Other | Admitting: Internal Medicine

## 2022-01-03 ENCOUNTER — Encounter: Payer: Self-pay | Admitting: Internal Medicine

## 2022-01-03 VITALS — BP 98/62 | HR 67 | Temp 98.1°F | Resp 16 | Wt 116.0 lb

## 2022-01-03 DIAGNOSIS — F03918 Unspecified dementia, unspecified severity, with other behavioral disturbance: Secondary | ICD-10-CM

## 2022-01-03 DIAGNOSIS — Z515 Encounter for palliative care: Secondary | ICD-10-CM

## 2022-01-03 DIAGNOSIS — R634 Abnormal weight loss: Secondary | ICD-10-CM

## 2022-01-03 DIAGNOSIS — R410 Disorientation, unspecified: Secondary | ICD-10-CM

## 2022-01-03 DIAGNOSIS — K224 Dyskinesia of esophagus: Secondary | ICD-10-CM

## 2022-01-03 NOTE — Progress Notes (Signed)
[facility] Visit Telephone: [phone]  Fax: [phone]   Date of encounter: 01/03/22 9:10 AM PATIENT NAME: Amanda Hurley [address] [place] [address]   [phone] (home)  DOB: 09/17/1928 MRN: [id number] PRIMARY CARE PROVIDER:    Burnard Bunting, MD,  [place] [address] [phone]  REFERRING PROVIDER:   Burnard Bunting, [place],  [place] [address] [phone]  RESPONSIBLE PARTY:    Contact Information     Name Relation Home Work [place] Daughter [phone]  [phone]   Ella, [place] [phone]  [phone]        I met face to face with patient and family in her home. Palliative Care was asked to follow this patient by consultation request of  Burnard Bunting, MD to address advance care planning and complex medical decision making. This is follow-up visit.                                     ASSESSMENT AND PLAN / RECOMMENDATIONS:   Advance Care Planning/Goals of Care: Goals include to maximize quality of life and symptom management. Patient/health care surrogate gave his/her permission to discuss.Our advance care planning conversation included a discussion about:    The value and importance of advance care planning  Experiences with loved ones who have been seriously ill or have died  Exploration of personal, cultural or spiritual beliefs that might influence medical decisions  Exploration of goals of care in the event of a sudden injury or illness  Identification  of a healthcare agent  Review and updating or creation of an  advance directive document . Decision not to resuscitate or to de-escalate disease focused treatments due to poor prognosis. CODE STATUS:  DNR, MOST on file; goals are comfort-based and to stay in her home for the remainder of her life  Symptom Management/Plan: 1. Delirium -may be having a recurrence of  this; however, it appears to me that her symptoms are now dementia progression where she has periods of time where she is more talkative and confused than others; she's had no localizing symptoms or fever and no obvious episodes of aspiration -continue to monitor and if s/s develop, could treat accordingly  2. Esophageal dysmotility -remains her biggest issue--family and caregivers continue to trial different items in pureed form to help with progressive weight loss but this remains ongoing though very gradual  3. Dementia with behavioral disturbance ([place]) -she does hallucinate and this is gradually progressing with her abnormal weight loss related to dysphagia being more prominent -continue 24x7 presence of family and/or caregivers and use of walker for mobility -recommended [employer] re: toothbrushing  4. Abnormal weight loss -down a fraction of a lb this time--does not finish her beverages and requires encouragement with pureed food items sometimes, does like her boost  5. Palliative care by specialist -continue to follow patient over time and offer caregiver support and suggestions along the way, trying to help avoid acute decompensation by catching infections and complications early; remains mobile with walker and able to feed self--speaks regularly, but weight loss ongoing -f/u in 2 months     Follow up Palliative Care Visit: Palliative care will continue to follow for complex medical decision making, advance care planning, and clarification of goals. Return 02/28/2022 and prn.  This visit was coded based on medical decision making (  MDM).  PPS: 40%  HOSPICE ELIGIBILITY/DIAGNOSIS: TBD  Chief Complaint: Follow-up palliative visit  HISTORY OF PRESENT ILLNESS:  Amanda Hurley is a 88 y.o. year old female  with Alzheimer's with behaviors, h/o fall and compression fx that has healed clinically, abnormal weight loss related to her dementia and esophageal dysphagia/diverticulum.    Struggling with brushing teeth. Had swollen feet two days ago. She was drinking orange gatorade this am.   They are very slowly reintroducing things--yesterday, she had a slightly green mashed up banana which she loved.  Still loves her boost daily. Pt's other daughter is going to dry little bites of watermelon.  Did not work when tried last time. She is drinking tomato soup.    Doing packets of baby food Still doing smoothies with ice cream and probiotic yogurt in them.  She told me she had been here a week and a half.  She plans to go back home.  Talks about back in the day picking tobacco.  Last UA done was negative for infection.  She's very talkative and confused talking about the past more than usual today.  She was that way last night.  Yesterday, she walked around the house inside without her walker--did not fall.  The last few weeks, she has not been up while [name] is still here.  Her back did hurt her while brushing her teeth and she sat to do that.  Talks even when no one else is in the room.  Sleeping overnight.  She wanted to go outside a few times last night.  Her son was having to redirect her.  He may have given her liquid tylenol last night and they do have liquid melatonin.  She gets worn out just washing her face and brushing her teeth.  No changes in urine.  Sometimes doesn't want to get up to go on a schedule.  She has had accidents.  She gets up by herself to urinate at night.    They are getting poise pads and overnight depends from [employer], but they are still expensive.  The insurance company doesn't have those.  The brand they tried did not work and they only have pantiliners.  Has small hard bms each day.    History obtained from review of EMR, discussion with primary team, and interview with family, facility staff/caregiver and/or Ms. Klosowski.   I reviewed available labs, medications, imaging, studies and related documents from the EMR.  Records reviewed and summarized  above.   ROS Review of Systems  Constitutional:  Positive for fatigue and unexpected weight change. Negative for activity change, appetite change, chills and fever.  HENT:  Positive for trouble swallowing. Negative for congestion.   Eyes:  Negative for visual disturbance.  Respiratory:  Positive for cough. Negative for apnea and shortness of breath.   Cardiovascular:  Negative for chest pain, palpitations and leg swelling.  Gastrointestinal:  Negative for abdominal pain and constipation.  Endocrine: Negative for cold intolerance.  Genitourinary:  Positive for urgency. Negative for dysuria and frequency.  Musculoskeletal:  Positive for back pain and gait problem. Negative for neck pain.  Skin:  Negative for color change.  Allergic/Immunologic: Negative for immunocompromised state.  Neurological:  Positive for weakness.  Hematological:  Bruises/bleeds easily.  Psychiatric/Behavioral:  Positive for confusion and hallucinations. Negative for sleep disturbance and suicidal ideas. The patient is not nervous/anxious.     Physical Exam: Vitals:   01/03/22 0903  BP: 98/62  Pulse: 67  Resp: 16  Temp: 98.1 F (36.7 C)  SpO2: 96%  Weight: 116 lb (52.6 kg)   Body mass index is 18.72 kg/m. Wt Readings from Last 500 Encounters:  01/03/22 116 lb (52.6 kg)  11/15/21 116 lb 9.6 oz (52.9 kg)  10/11/21 118 lb (53.5 kg)  01/27/20 119 lb 0.8 oz (54 kg)  04/10/19 128 lb (58.1 kg)  08/25/18 126 lb (57.2 kg)  03/26/18 125 lb (56.7 kg)  02/04/18 130 lb (59 kg)  12/22/16 134 lb (60.8 kg)  07/04/16 135 lb (61.2 kg)  10/21/14 146 lb 1.9 oz (66.3 kg)   Physical Exam Constitutional:      General: She is not in acute distress.    Appearance: She is not ill-appearing.     Comments: Frail female  HENT:     Head: Normocephalic and atraumatic.  Cardiovascular:     Rate and Rhythm: Normal rate and regular rhythm.     Pulses: Normal pulses.     Heart sounds: Normal heart sounds.  Pulmonary:      Effort: Pulmonary effort is normal.     Breath sounds: Normal breath sounds. No wheezing, rhonchi or rales.  Abdominal:     General: Bowel sounds are normal.     Palpations: Abdomen is soft.  Neurological:     General: No focal deficit present.     Mental Status: She is alert.     Gait: Gait abnormal.     Comments: Oriented to familiar people only, not time or place--wants to go back home all of the time to where she previously lived; to be using walker but trying to go without it recently  Psychiatric:        Mood and Affect: Mood normal.     Comments: Very talkative and speaking about the past and incorrect information about present     CURRENT PROBLEM LIST:  Patient Active Problem List   Diagnosis Date Noted   Hallucinations 10/11/2021   Hypertension 10/11/2021   Insomnia 10/11/2021   Esophageal dysmotility 09/20/2021   Esophageal diverticulum 09/20/2021   Abnormal weight loss 09/20/2021   Chronic kidney disease, stage 3a ([facility]) 10/21/2019   Fatigue 07/10/2019   Dementia with behavioral disturbance ([facility]) 04/10/2019   Unexplained night sweats 12/23/2016   Abnormal CXR 12/22/2016    PAST MEDICAL HISTORY:  Active Ambulatory Problems    Diagnosis Date Noted   Abnormal CXR 12/22/2016   Unexplained night sweats 12/23/2016   Dementia with behavioral disturbance ([facility]) 04/10/2019   Esophageal dysmotility 09/20/2021   Esophageal diverticulum 09/20/2021   Abnormal weight loss 09/20/2021   Chronic kidney disease, stage 3a ([facility]) 10/21/2019   Fatigue 07/10/2019   Hallucinations 10/11/2021   Hypertension 10/11/2021   Insomnia 10/11/2021   Resolved Ambulatory Problems    Diagnosis Date Noted   No Resolved Ambulatory Problems   Past Medical History:  Diagnosis Date   AAA (abdominal aortic aneurysm) ([facility])    Dementia ([facility])    Dysphagia    Hyperlipidemia     SOCIAL HX:  Social History   Tobacco Use   Smoking status: Never   Smokeless tobacco: Never  Substance Use  Topics   Alcohol use: No    Alcohol/week: 0.0 standard drinks     ALLERGIES: No Known Allergies    PERTINENT MEDICATIONS:  Outpatient Encounter Medications as of 01/03/2022  Medication Sig   carbamide peroxide (DEBROX) 6.5 % OTIC solution 3-4 drops   cephALEXin (KEFLEX) 125 MG/5ML suspension Take by mouth.   No facility-administered encounter  medications on file as of 01/03/2022.    Thank you for the opportunity to participate in the care of Ms. Simoneau.  The palliative care team will continue to follow. Please call our office at [phone] if we can be of additional assistance.   Hollace Kinnier, DO  COVID-19 PATIENT SCREENING TOOL Asked and negative response unless otherwise noted:  Have you had symptoms of covid, tested positive or been in contact with someone with symptoms/positive test in the past 5-10 days? no

## 2022-02-28 ENCOUNTER — Other Ambulatory Visit: Payer: Medicare Other | Admitting: Internal Medicine

## 2022-02-28 ENCOUNTER — Encounter: Payer: Self-pay | Admitting: Internal Medicine

## 2022-02-28 VITALS — BP 110/70 | HR 78 | Temp 97.7°F | Resp 16 | Wt 119.4 lb

## 2022-02-28 DIAGNOSIS — Z515 Encounter for palliative care: Secondary | ICD-10-CM

## 2022-02-28 DIAGNOSIS — R339 Retention of urine, unspecified: Secondary | ICD-10-CM

## 2022-02-28 DIAGNOSIS — F03918 Unspecified dementia, unspecified severity, with other behavioral disturbance: Secondary | ICD-10-CM

## 2022-02-28 DIAGNOSIS — K224 Dyskinesia of esophagus: Secondary | ICD-10-CM

## 2022-02-28 DIAGNOSIS — R634 Abnormal weight loss: Secondary | ICD-10-CM

## 2022-02-28 NOTE — Progress Notes (Signed)
[place] Visit Telephone: [phone]  Fax: [phone]   Date of encounter: 02/28/22 9:25 AM PATIENT NAME: Amanda Hurley [address] [place] [address]   [phone] (home)  DOB: [birth year] MRN: [id number] PRIMARY CARE PROVIDER:    Burnard Bunting, MD,  [place] [address] [phone]  REFERRING PROVIDER:   Burnard Bunting, [place],  [place] [address] [phone]  RESPONSIBLE PARTY:    Contact Information     Name Relation Home Work [place] Daughter [phone]  [phone]   Ashaunte, [place] [phone]  [phone]        I met face to face with patient and family in her home. Palliative Care was asked to follow this patient by consultation request of  Burnard Bunting, MD to address advance care planning and complex medical decision making. This is follow-up visit.                                     ASSESSMENT AND PLAN / RECOMMENDATIONS:   Advance Care Planning/Goals of Care: Goals include to maximize quality of life and symptom management. Patient/health care surrogate gave his/her permission to discuss.Our advance care planning conversation included a discussion about:    The value and importance of advance care planning  Experiences with loved ones who have been seriously ill or have died  Exploration of personal, cultural or spiritual beliefs that might influence medical decisions  Exploration of goals of care in the event of a sudden injury or illness  Identification  of a healthcare agent  Review and updating or creation of an  advance directive document . Decision not to resuscitate or to de-escalate disease focused treatments due to poor prognosis. CODE STATUS:  DNR, MOST on file  Symptom Management/Plan: 1. Esophageal dysmotility -ongoing, continue current pureed diet, primarily liquids  2. Dementia with behavioral disturbance  ([place]) -gradually progressive, living in the past, pleasant though less talkative than last time, needs help with ADLs, able to toilet self at night sometimes  3. Abnormal weight loss -wt up a few lbs with improved appetite since last visit  4. Incomplete bladder emptying -ongoing, recommended sitting for a few minutes and asking her to push out any remaining urine  5. Palliative care by specialist -continue to follow over time, has ups and downs primarily related to dysphagia and episodes of choking    Follow up Palliative Care Visit: Palliative care will continue to follow for complex medical decision making, advance care planning, and clarification of goals. Return 05/30/2022 And prn.  This visit was coded based on medical decision making (MDM).  PPS: 40%  HOSPICE ELIGIBILITY/DIAGNOSIS: TBD  Chief Complaint: Follow-up palliative visit  HISTORY OF PRESENT ILLNESS:  Amanda Hurley is a 88 y.o. year old female  with alzheimer's disease, dysphagia, falls, low back pain, prior pressure injury, weight loss seen for palliative f/u at home with her daughter, [name].   She's had a bigger appetite asking for things like peanuts that she cannot eat.  Family and caregivers are more creative with food items.  A few apple cinnamon puffs have gotten introduced but they can't find them anymore.  She did not choke on them and she drank afterwards. She also gets 1/3 or 1/2 banana which she slowly bites and chews.  Likes to chew items.  Have done pureed sweet potatoes.  No falls.  Has been pretty steady as long as they don't rush.  Has visit with urology coming up.  She thinks she's finished, but it keeps coming.  Sits for a while, but then back hurts.  Do a quick pull-up.  She did have a bladder tack in late 58s early 21s.  Not having wet bedsheets.  Depends adequate and does still get up and go on her own sometimes.  No difficulty with her skin or wounds.  Has a small place on her skin that bothers  her and they're putting hydrocortisone on it--was age spot, nothing to worry about. Right cheek by chin.  Aspercreme is helpful for her lower back.    Remembers her dad died after saying she wants to see her mommy and daddy.    Every once in a while, she'll get constipated. Working through it with a little pineapple juice in gatorade.  About a week ago, she had an episode of vomiting.  She had been sitting in the heat of the sunroom and would NOT come inside for caregiver.  Had a pepto chewable and swallowed it with water, then she vomited again and then resolved.   History obtained from review of EMR, discussion with primary team, and interview with family, facility staff/caregiver and/or Ms. Grinage  I reviewed available labs, medications, imaging, studies and related documents from the EMR.  Records reviewed and summarized above.   ROS Review of Systems  Constitutional:  Positive for appetite change and fatigue. Negative for activity change, chills, fever and unexpected weight change.  HENT:  Positive for trouble swallowing.   Eyes:  Negative for visual disturbance.  Respiratory:  Negative for chest tightness and shortness of breath.   Cardiovascular:  Negative for chest pain and leg swelling.  Gastrointestinal:  Positive for constipation. Negative for abdominal pain, blood in stool, diarrhea, nausea and vomiting.  Genitourinary:        Dribbling of urine after "finished"  Musculoskeletal:  Positive for back pain and gait problem.  Skin:  Negative for color change.  Neurological:  Negative for dizziness and weakness.  Psychiatric/Behavioral:  Negative for sleep disturbance.     Physical Exam: Vitals:   02/28/22 0917  BP: 110/70  Pulse: 78  Resp: 16  Temp: 97.7 F (36.5 C)  SpO2: (!) 88%  Weight: 119 lb 6.4 oz (54.2 kg)   Body mass index is 19.27 kg/m. Wt Readings from Last 500 Encounters:  02/28/22 119 lb 6.4 oz (54.2 kg)  01/03/22 116 lb (52.6 kg)  11/15/21 116 lb 9.6 oz  (52.9 kg)  10/11/21 118 lb (53.5 kg)  01/27/20 119 lb 0.8 oz (54 kg)  04/10/19 128 lb (58.1 kg)  08/25/18 126 lb (57.2 kg)  03/26/18 125 lb (56.7 kg)  02/04/18 130 lb (59 kg)  12/22/16 134 lb (60.8 kg)  07/04/16 135 lb (61.2 kg)  10/21/14 146 lb 1.9 oz (66.3 kg)   Physical Exam Constitutional:      Comments: Frail female  HENT:     Head: Normocephalic and atraumatic.  Cardiovascular:     Rate and Rhythm: Normal rate and regular rhythm.  Pulmonary:     Effort: Pulmonary effort is normal.     Breath sounds: Normal breath sounds. No wheezing, rhonchi or rales.  Abdominal:     General: Bowel sounds are normal.     Palpations: Abdomen is soft.  Musculoskeletal:        General: Normal range of motion.  Comments: Walks with rolling walker  Skin:    General: Skin is warm and dry.  Neurological:     General: No focal deficit present.     Mental Status: She is alert.     Comments: Short-term memory loss, living in the past  Psychiatric:        Mood and Affect: Mood normal.     CURRENT PROBLEM LIST:  Patient Active Problem List   Diagnosis Date Noted   Hallucinations 10/11/2021   Hypertension 10/11/2021   Insomnia 10/11/2021   Esophageal dysmotility 09/20/2021   Esophageal diverticulum 09/20/2021   Abnormal weight loss 09/20/2021   Chronic kidney disease, stage 3a ([place]) 10/21/2019   Fatigue 07/10/2019   Dementia with behavioral disturbance ([facility]) 04/10/2019   Unexplained night sweats 12/23/2016   Abnormal CXR 12/22/2016    PAST MEDICAL HISTORY:  Active Ambulatory Problems    Diagnosis Date Noted   Abnormal CXR 12/22/2016   Unexplained night sweats 12/23/2016   Dementia with behavioral disturbance ([place] [facility]) 04/10/2019   Esophageal dysmotility 09/20/2021   Esophageal diverticulum 09/20/2021   Abnormal weight loss 09/20/2021   Chronic kidney disease, stage 3a ([place]) 10/21/2019   Fatigue 07/10/2019   Hallucinations 10/11/2021   Hypertension 10/11/2021   Insomnia  10/11/2021   Resolved Ambulatory Problems    Diagnosis Date Noted   No Resolved Ambulatory Problems   Past Medical History:  Diagnosis Date   AAA (abdominal aortic aneurysm) (H[facility])    Dementia ([facility])    Dysphagia    Hyperlipidemia     SOCIAL HX:  Social History   Tobacco Use   Smoking status: Never   Smokeless tobacco: Never  Substance Use Topics   Alcohol use: No    Alcohol/week: 0.0 standard drinks of alcohol     ALLERGIES: No Known Allergies    PERTINENT MEDICATIONS:  Outpatient Encounter Medications as of 02/28/2022  Medication Sig   carbamide peroxide (DEBROX) 6.5 % OTIC solution 3-4 drops   cephALEXin (KEFLEX) 125 MG/5ML suspension Take by mouth.   No facility-administered encounter medications on file as of 02/28/2022.    Thank you for the opportunity to participate in the care of Ms. Hankinson.  The palliative care team will continue to follow. Please call our office at [phone] if we can be of additional assistance.   Hollace Kinnier, DO  COVID-19 PATIENT SCREENING TOOL Asked and negative response unless otherwise noted:  Have you had symptoms of covid, tested positive or been in contact with someone with symptoms/positive test in the past 5-10 days? No

## 2022-04-17 ENCOUNTER — Telehealth: Payer: Self-pay

## 2022-04-17 NOTE — Telephone Encounter (Signed)
1024- Palliative check in call. No answer, left voice message.

## 2022-04-20 ENCOUNTER — Other Ambulatory Visit: Payer: Medicare Other | Admitting: Internal Medicine

## 2022-04-20 VITALS — BP 110/70 | HR 90 | Temp 98.1°F | Wt 115.0 lb

## 2022-04-20 DIAGNOSIS — R634 Abnormal weight loss: Secondary | ICD-10-CM

## 2022-04-20 DIAGNOSIS — Z515 Encounter for palliative care: Secondary | ICD-10-CM

## 2022-04-20 DIAGNOSIS — K224 Dyskinesia of esophagus: Secondary | ICD-10-CM

## 2022-04-20 DIAGNOSIS — F03918 Unspecified dementia, unspecified severity, with other behavioral disturbance: Secondary | ICD-10-CM

## 2022-04-20 NOTE — Progress Notes (Signed)
[facility] Visit Telephone: [phone]  Fax: [phone]   Date of encounter: 04/20/22 10:51 PM PATIENT NAME: Amanda Hurley [address] [place] [address]   [phone] (home)  DOB: 09-05-1928 MRN: [id number] PRIMARY CARE PROVIDER:    Burnard Bunting, MD,  [place] [address] [phone]  REFERRING PROVIDER:   Burnard Bunting, [place],  [place] [address] [phone]  RESPONSIBLE PARTY:    Contact Information     Name Relation Home Work [place] Daughter [phone]  [phone]   Noely, [place] [phone]  [phone]        I met face to face with patient and family in her home. Palliative Care was asked to follow this patient by consultation request of  Burnard Bunting, MD to address advance care planning and complex medical decision making. This is follow-up visit.                                     ASSESSMENT AND PLAN / RECOMMENDATIONS:   Advance Care Planning/Goals of Care: Goals include to maximize quality of life and symptom management. Patient/health care surrogate gave his/her permission to discuss.Our advance care planning conversation included a discussion about:    The value and importance of advance care planning  Experiences with loved ones who have been seriously ill or have died  Exploration of personal, cultural or spiritual beliefs that might influence medical decisions  Exploration of goals of care in the event of a sudden injury or illness  Identification  of a healthcare agent--daughter Amanda Hurley Review and updating or creation of an  advance directive document . Decision not to resuscitate or to de-escalate disease focused treatments due to poor prognosis. CODE STATUS:  DNR, MOST on file  Symptom Management/Plan: 1. Esophageal dysmotility -has not had over episodes with this lately, simply not eating and drinking for  several days w/o other localizing signs -continue to offer supplement shakes particularly her chocolate boost and other liquids she tolerates  2. Abnormal weight loss -wt down 5.4 lbs since 8/1 visit with her recent lack of intake -discussed that if not improving, would recommend hospice care--Amanda then said that when she's at the very end they want that -explained benefits prior to last days of life -family going to monitor her and see how she does--she did drink 1.5 boosts while I was there, but was much more confused than she's been, very weak and not able to concentrate--family concerned it's due to change in concentration of keflex oral liquid abx, but technically she's getting the same dose so it should not affect her  3. Dementia with behavioral disturbance ([place]) -recently with significant decline in intake as approaches end of life  -no clear signs of other infection or etiology -continue family and caregiver support at home  -4. Palliative care by specialist if not improving with intake, recommend hospice eval     Follow up Palliative Care Visit: Palliative care will continue to follow for complex medical decision making, advance care planning, and clarification of goals. Return 05/30/2022 and prn.    This visit was coded based on medical decision making (MDM).  PPS: 30%  HOSPICE ELIGIBILITY/DIAGNOSIS: TBD  Chief Complaint: Follow-up palliative visit  HISTORY OF PRESENT ILLNESS:  Amanda Hurley is a 88 y.o. year old female  with end stages of dementia, weight loss,  recurrent utis, and now increased weakness, lack of intake, and overall increased confusion seen for palliative f/u due to family concerns about decline .   She's now on keflex $RemoveB'250mg'uBIziSYh$ /31ml instead of $RemoveBe'125mg'LBlxdjkqj$ /58ml for UTI prophylaxis.  Had last urology appt 3 wks ago with negative UA (8/31).    Amanda Hurley will be going out of the country and return 10/20.  Next regular visit with me is 10/31.  History obtained from  review of EMR, discussion with primary team, and interview with family, facility staff/caregiver and/or Ms. Hurley.   I reviewed available labs, medications, imaging, studies and related documents from the EMR.  Records reviewed and summarized above.   ROS Review of Systemssee hpi  Physical Exam: Vitals:   04/20/22 1344  BP: 110/70  Pulse: 90  Temp: 98.1 F (36.7 C)  SpO2: 97%  Weight: 115 lb (52.2 kg)   Body mass index is 18.56 kg/m. Wt Readings from Last 500 Encounters:  04/20/22 115 lb (52.2 kg)  02/28/22 119 lb 6.4 oz (54.2 kg)  01/03/22 116 lb (52.6 kg)  11/15/21 116 lb 9.6 oz (52.9 kg)  10/11/21 118 lb (53.5 kg)  01/27/20 119 lb 0.8 oz (54 kg)  04/10/19 128 lb (58.1 kg)  08/25/18 126 lb (57.2 kg)  03/26/18 125 lb (56.7 kg)  02/04/18 130 lb (59 kg)  12/22/16 134 lb (60.8 kg)  07/04/16 135 lb (61.2 kg)  10/21/14 146 lb 1.9 oz (66.3 kg)   Physical Exam Constitutional:      Appearance: She is ill-appearing.     Comments: fRail, confused  HENT:     Head: Normocephalic and atraumatic.  Cardiovascular:     Rate and Rhythm: Normal rate and regular rhythm.  Pulmonary:     Effort: Pulmonary effort is normal.     Breath sounds: Normal breath sounds.  Abdominal:     General: Bowel sounds are normal.     Palpations: Abdomen is soft.     Tenderness: There is no abdominal tenderness.  Musculoskeletal:        General: Normal range of motion.     Right lower leg: No edema.     Left lower leg: No edema.     Comments: Did ambulate supported from bedroom to living room very slowly with walker and two other people  Skin:    General: Skin is warm and dry.  Neurological:     General: No focal deficit present.     Motor: Weakness present.     Gait: Gait abnormal.  Psychiatric:        Mood and Affect: Mood normal.     CURRENT PROBLEM LIST:  Patient Active Problem List   Diagnosis Date Noted   Hallucinations 10/11/2021   Hypertension 10/11/2021   Insomnia 10/11/2021    Esophageal dysmotility 09/20/2021   Esophageal diverticulum 09/20/2021   Abnormal weight loss 09/20/2021   Chronic kidney disease, stage 3a ([facility]) 10/21/2019   Fatigue 07/10/2019   Dementia with behavioral disturbance ([facility]) 04/10/2019   Unexplained night sweats 12/23/2016   Abnormal CXR 12/22/2016    PAST MEDICAL HISTORY:  Active Ambulatory Problems    Diagnosis Date Noted   Abnormal CXR 12/22/2016   Unexplained night sweats 12/23/2016   Dementia with behavioral disturbance ([facility]) 04/10/2019   Esophageal dysmotility 09/20/2021   Esophageal diverticulum 09/20/2021   Abnormal weight loss 09/20/2021   Chronic kidney disease, stage 3a ([facility]) 10/21/2019   Fatigue 07/10/2019   Hallucinations 10/11/2021   Hypertension 10/11/2021   Insomnia 10/11/2021  Resolved Ambulatory Problems    Diagnosis Date Noted   No Resolved Ambulatory Problems   Past Medical History:  Diagnosis Date   AAA (abdominal aortic aneurysm) ([facility])    Dementia ([facility])    Dysphagia    Hyperlipidemia     SOCIAL HX:  Social History   Tobacco Use   Smoking status: Never   Smokeless tobacco: Never  Substance Use Topics   Alcohol use: No    Alcohol/week: 0.0 standard drinks of alcohol     ALLERGIES: No Known Allergies    PERTINENT MEDICATIONS:  Outpatient Encounter Medications as of 04/20/2022  Medication Sig   carbamide peroxide (DEBROX) 6.5 % OTIC solution 3-4 drops   cephALEXin (KEFLEX) 125 MG/5ML suspension Take by mouth.   No facility-administered encounter medications on file as of 04/20/2022.    Thank you for the opportunity to participate in the care of Ms. Laseter.  The palliative care team will continue to follow. Please call our office at [phone] if we can be of additional assistance.   Hollace Kinnier, DO  COVID-19 PATIENT SCREENING TOOL Asked and negative response unless otherwise noted:  Have you had symptoms of covid, tested positive or been in contact with someone with symptoms/positive  test in the past 5-10 days? No

## 2022-04-29 ENCOUNTER — Encounter: Payer: Self-pay | Admitting: Internal Medicine

## 2022-05-30 ENCOUNTER — Encounter: Payer: Self-pay | Admitting: Internal Medicine

## 2022-05-30 ENCOUNTER — Other Ambulatory Visit: Payer: Medicare Other | Admitting: Internal Medicine

## 2022-05-30 VITALS — BP 118/80 | HR 78 | Temp 97.8°F | Resp 16 | Wt 117.2 lb

## 2022-05-30 DIAGNOSIS — K224 Dyskinesia of esophagus: Secondary | ICD-10-CM

## 2022-05-30 DIAGNOSIS — Z515 Encounter for palliative care: Secondary | ICD-10-CM

## 2022-05-30 DIAGNOSIS — F03918 Unspecified dementia, unspecified severity, with other behavioral disturbance: Secondary | ICD-10-CM

## 2022-05-30 DIAGNOSIS — R634 Abnormal weight loss: Secondary | ICD-10-CM

## 2022-05-30 NOTE — Progress Notes (Signed)
[facility] Visit Telephone: [phone]  Fax: [phone]   Date of encounter: 05/30/22 9:14 AM PATIENT NAME: Amanda Hurley [address] [place] [address]   [phone] (home)  DOB: 29-Mar-1929 MRN: [id number] PRIMARY CARE PROVIDER:    Burnard Bunting, MD,  [place] [address] [phone]  REFERRING PROVIDER:   Burnard Bunting, [place],   [address] [phone]  RESPONSIBLE PARTY:    Contact Information     Name Relation Home Work [place] Daughter [phone]  [phone]   Meleni, Delahunt [phone]  [phone]        I met face to face with patient and family in her home. Palliative Care was asked to follow this patient by consultation request of  Burnard Bunting, MD to address advance care planning and complex medical decision making. This is follow-up visit.                                    ASSESSMENT AND PLAN / RECOMMENDATIONS:    Advance Care Planning/Goals of Care: Goals include to maximize quality of life and symptom management. Patient/health care surrogate gave his/her permission to discuss.Our advance care planning conversation included a discussion about:    The value and importance of advance care planning  Experiences with loved ones who have been seriously ill or have died  Exploration of personal, cultural or spiritual beliefs that might influence medical decisions  Exploration of goals of care in the event of a sudden injury or illness  Identification  of a healthcare agent--daughter Amanda Hurley Review and updating or creation of an  advance directive document . Decision not to resuscitate or to de-escalate disease focused treatments due to poor prognosis. CODE STATUS:  DNR, MOST on file  Symptom Management/Plan: 1. Esophageal dysmotility -remains a major concern in conjunction with her progressive dementia -on pureed  diet of primarily shakes and boost, rarely other items are pureed if she craves them--has had impactions and aspirations with other textures  2. Abnormal weight loss -wt up 2 lbs from last time though scale was oscillating and she was a bit agitated about it today  3. Dementia with behavioral disturbance ([place]) -progressing--more confused this time and having difficulty following commands--needing redirection and more cues to weigh, drink boost, eat banana -is borderline hospice--has frequent bouts of decline but bounces partway back up  4. Palliative care by specialist -cont visits with RN/SW to reassess her due to borderline hospice status -f/u 6-8 wks with them   Follow up Palliative Care Visit: Palliative care will continue to follow for complex medical decision making, advance care planning, and clarification of goals. Return 6-8 weeks or prn.  This visit was coded based on medical decision making (MDM).  PPS: weak 50% (needs adl help but still ambulates with walker with someone behind her)  HOSPICE ELIGIBILITY/DIAGNOSIS: borderline for abnormal weight loss/dementia  Chief Complaint: Follow-up palliative visit  HISTORY OF PRESENT ILLNESS:  Amanda Hurley is a 88 y.o. year old female  with esophageal dysmotility, severe dysphagia on pureed diet, Alzheimer's disease, progressing with FAST 6c to e depending on where she is that visit, osteoporosis,htn, hyperlipidemia, hallucinations, insomnia, and CKD3a.   No problems while her daughter was on her vacation.  Usual comfort keeper aides were here except Tuesdays.    She's having smoothies and other pureed items only.  Getting boost.  She apparently had french fries yesterday which are not part of her dietary restrictions.  She's had no major episodes of choking.    She's had a little hoarseness over the weekend--not today.  Had some congestion also yesterday.  Apparently, she was worse than yesterday over the weekend.  She does have a h/o  allergies.  Amanda Hurley asks what to use--suggested using children's zyrtec liquid.  She's more confused today than usual.    Bowels are moving ok.   Some erythema to sacrum upon Denise's return from her trip, but using aquaphor with improvement.    History obtained from review of EMR, discussion with primary team, and interview with family, facility staff/caregiver and/or Ms. Pompa.  I reviewed available labs, medications, imaging, studies and related documents from the EMR.  Records reviewed and summarized above.   ROS Review of Systems see hpi  Physical Exam: Vitals:   05/30/22 0908  BP: 118/80  Pulse: 78  Resp: 16  Temp: 97.8 F (36.6 C)  SpO2: 97%  Weight: 117 lb 3.2 oz (53.2 kg)   Body mass index is 18.92 kg/m. Wt Readings from Last 500 Encounters:  05/30/22 117 lb 3.2 oz (53.2 kg)  04/20/22 115 lb (52.2 kg)  02/28/22 119 lb 6.4 oz (54.2 kg)  01/03/22 116 lb (52.6 kg)  11/15/21 116 lb 9.6 oz (52.9 kg)  10/11/21 118 lb (53.5 kg)  01/27/20 119 lb 0.8 oz (54 kg)  04/10/19 128 lb (58.1 kg)  08/25/18 126 lb (57.2 kg)  03/26/18 125 lb (56.7 kg)  02/04/18 130 lb (59 kg)  12/22/16 134 lb (60.8 kg)  07/04/16 135 lb (61.2 kg)  10/21/14 146 lb 1.9 oz (66.3 kg)   Physical Exam Constitutional:      Appearance: She is ill-appearing.     Comments: Frail female, appears to have more buccal wasting, dark circles today  HENT:     Head: Normocephalic and atraumatic.  Cardiovascular:     Rate and Rhythm: Normal rate and regular rhythm.     Pulses: Normal pulses.     Heart sounds: Normal heart sounds.  Pulmonary:     Effort: Pulmonary effort is normal.     Breath sounds: Normal breath sounds.  Abdominal:     General: Bowel sounds are normal.  Musculoskeletal:        General: Normal range of motion.     Right lower leg: No edema.     Left lower leg: No edema.     Comments: Uses walker with lots of cues  Skin:    General: Skin is warm and dry.     Comments: Erythema of  sacrum reported  Neurological:     General: No focal deficit present.     Mental Status: She is alert.     Comments: Confused more than usual today  Psychiatric:     Comments: A bit agitated      CURRENT PROBLEM LIST:  Patient Active Problem List   Diagnosis Date Noted   Hallucinations 10/11/2021   Hypertension 10/11/2021   Insomnia 10/11/2021   Esophageal dysmotility 09/20/2021   Esophageal diverticulum 09/20/2021   Abnormal weight loss 09/20/2021   Chronic kidney disease, stage 3a ([place]) 10/21/2019   Fatigue 07/10/2019   Dementia with behavioral disturbance ([facility]) 04/10/2019   Unexplained night sweats 12/23/2016   Abnormal CXR 12/22/2016    PAST MEDICAL HISTORY:  Active Ambulatory Problems    Diagnosis Date Noted   Abnormal CXR 12/22/2016  Unexplained night sweats 12/23/2016   Dementia with behavioral disturbance ([place]) 04/10/2019   Esophageal dysmotility 09/20/2021   Esophageal diverticulum 09/20/2021   Abnormal weight loss 09/20/2021   Chronic kidney disease, stage 3a ([place]) 10/21/2019   Fatigue 07/10/2019   Hallucinations 10/11/2021   Hypertension 10/11/2021   Insomnia 10/11/2021   Resolved Ambulatory Problems    Diagnosis Date Noted   No Resolved Ambulatory Problems   Past Medical History:  Diagnosis Date   AAA (abdominal aortic aneurysm) (HCC)    Dementia (HCC)    Dysphagia    Hyperlipidemia     SOCIAL HX:  Social History   Tobacco Use   Smoking status: Never   Smokeless tobacco: Never  Substance Use Topics   Alcohol use: No    Alcohol/week: 0.0 standard drinks of alcohol     ALLERGIES: No Known Allergies    PERTINENT MEDICATIONS:  Outpatient Encounter Medications as of 05/30/2022  Medication Sig   carbamide peroxide (DEBROX) 6.5 % OTIC solution 3-4 drops   cephALEXin (KEFLEX) 125 MG/5ML suspension Take by mouth.   No facility-administered encounter medications on file as of 05/30/2022.    Thank you for the opportunity to participate in  the care of Ms. Caravello.  The palliative care team will continue to follow. Please call our office at [phone] if we can be of additional assistance.   Hollace Kinnier, DO  COVID-19 PATIENT SCREENING TOOL Asked and negative response unless otherwise noted:  Have you had symptoms of covid, tested positive or been in contact with someone with symptoms/positive test in the past 5-10 days? no

## 2022-07-06 ENCOUNTER — Non-Acute Institutional Stay: Payer: Medicare Other | Admitting: Family Medicine

## 2022-07-06 ENCOUNTER — Encounter: Payer: Self-pay | Admitting: Family Medicine

## 2022-07-06 VITALS — HR 57 | Temp 98.9°F | Resp 18

## 2022-07-06 DIAGNOSIS — R051 Acute cough: Secondary | ICD-10-CM | POA: Insufficient documentation

## 2022-07-06 DIAGNOSIS — Z9889 Other specified postprocedural states: Secondary | ICD-10-CM

## 2022-07-06 DIAGNOSIS — F03918 Unspecified dementia, unspecified severity, with other behavioral disturbance: Secondary | ICD-10-CM

## 2022-07-06 IMAGING — RF DG SWALLOWING FUNCTION
13 series · 19 of 24 positions shown · non-contrast
Comparison: Esophagram 10/06/2015.

CLINICAL DATA: Provided history: Dysphagia, unspecified type.
Additional history provided by patient's daughter: Patient only able
to tolerate liquids. Regurgitation of puree and solid consistency
foods.

EXAM:
MODIFIED BARIUM SWALLOW
TECHNIQUE: Different consistencies of barium were administered orally to the
patient by the Speech Pathologist. Imaging of the pharynx was
performed in the lateral projection. The radiologist was present in
the fluoroscopy room for this study, providing personal supervision.
FLUOROSCOPY:
Fluoroscopy Time:  1 minute, 12 seconds.
Radiation Exposure Index (if provided by the fluoroscopic device):
4.9 mGy

[Series 1: cp_standard · 2 of 24 frames shown (1 of 13)]
[frame 4/24]
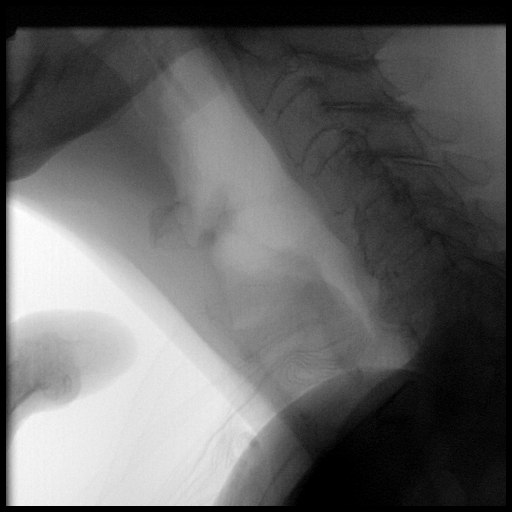
[frame 13/24]
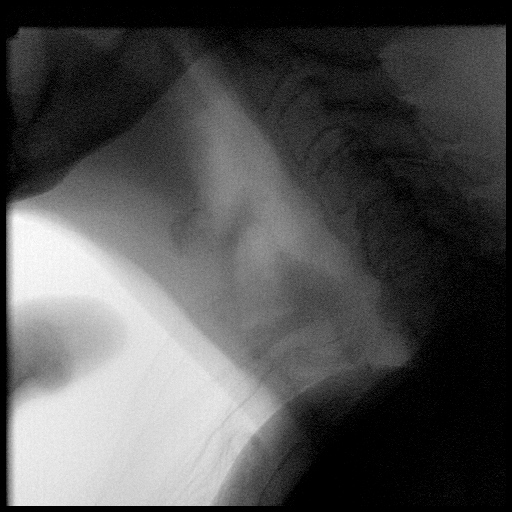

[Series 2: cp_standard · 1 of 16 frames shown (2 of 13)]
[frame 14/16]
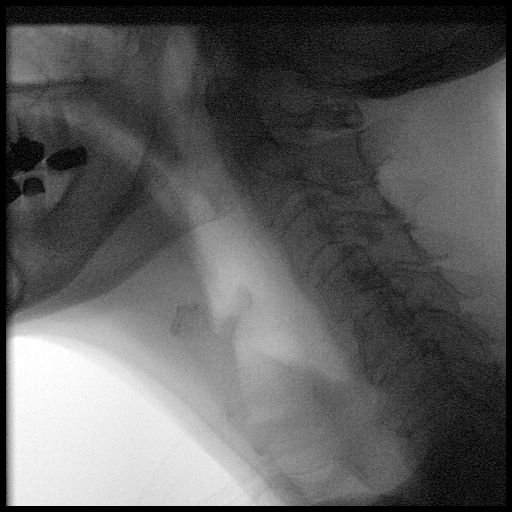

[Series 3: cp_standard · 2 of 22 frames shown (3 of 13)]
[frame 12/22]
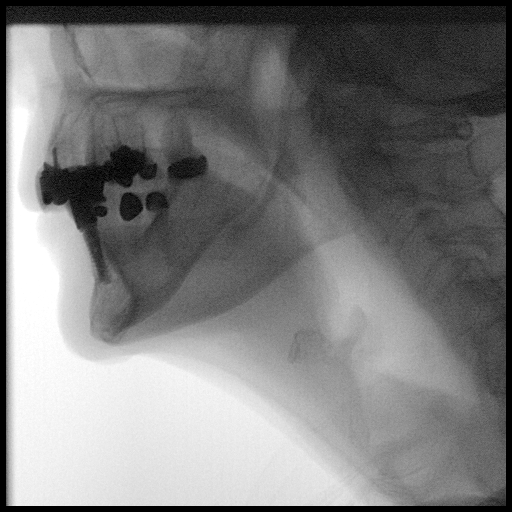
[frame 22/22]
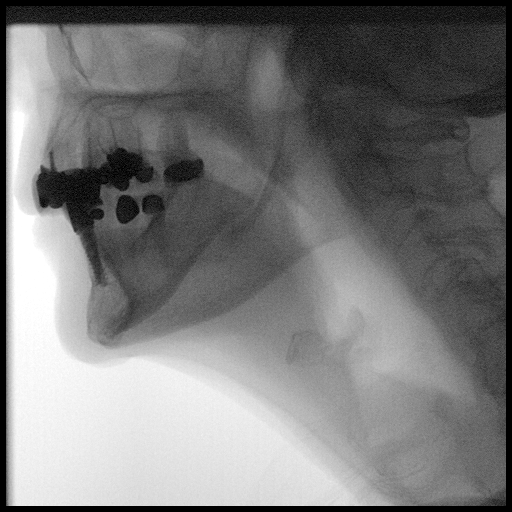

[Series 4: cp_standard · 1 of 123 frames shown (4 of 13)]
[frame 62/123]
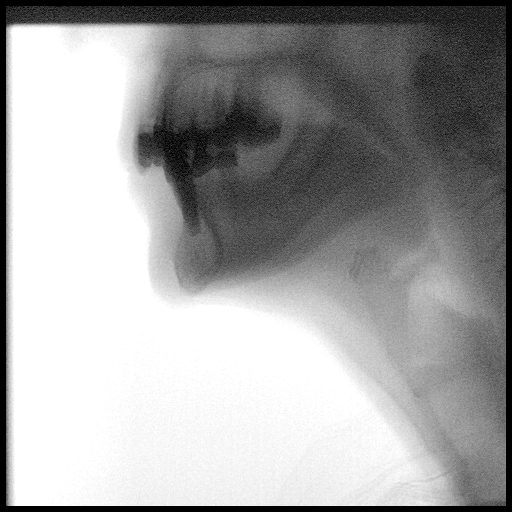

[Series 5: cp_standard · 1 of 216 frames shown (5 of 13)]
[frame 109/216]
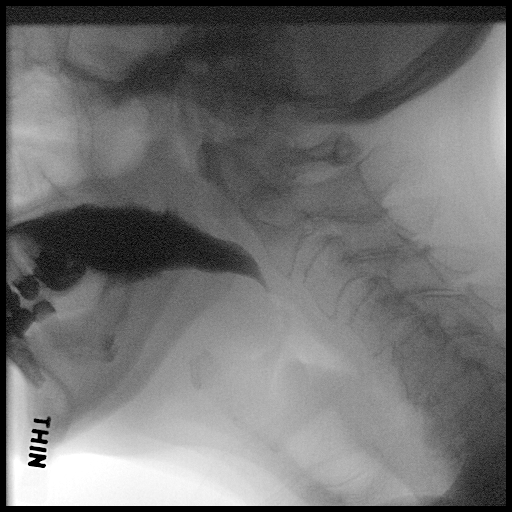

[Series 6: cp_standard · 2 of 165 frames shown (6 of 13)]
[frame 25/165]
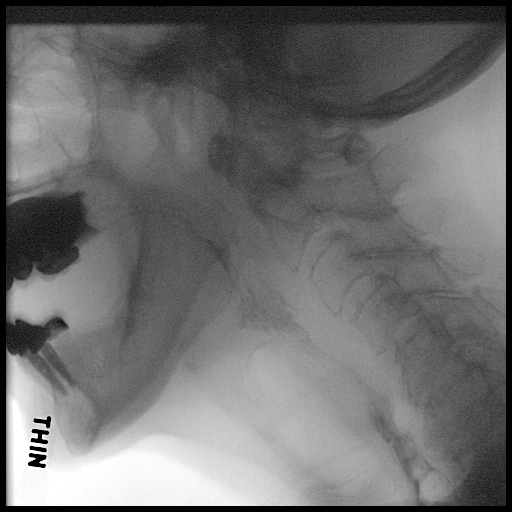
[frame 141/165]
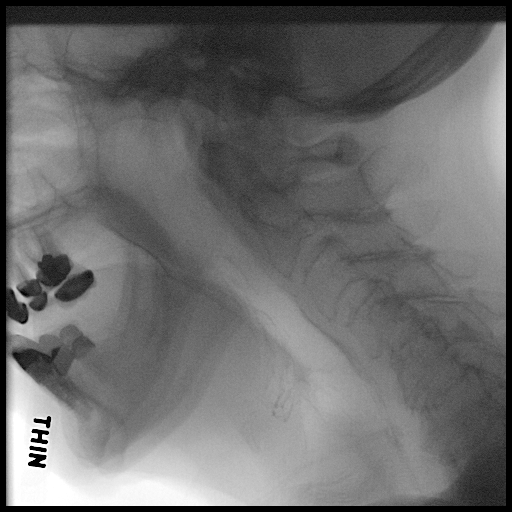

[Series 7: cp_standard · 1 of 450 frames shown (7 of 13)]
[frame 383/450]
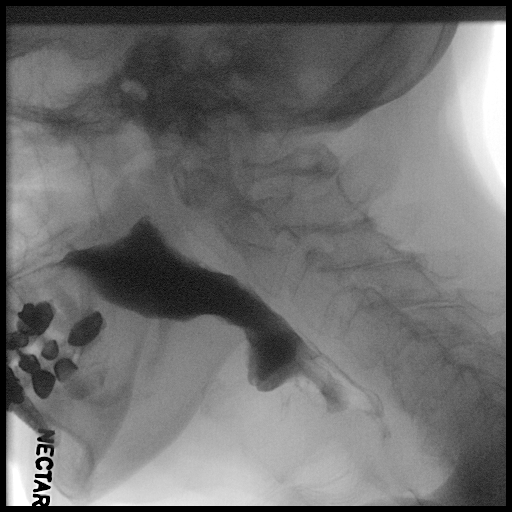

[Series 8: cp_standard · 2 of 286 frames shown (8 of 13)]
[frame 143/286]
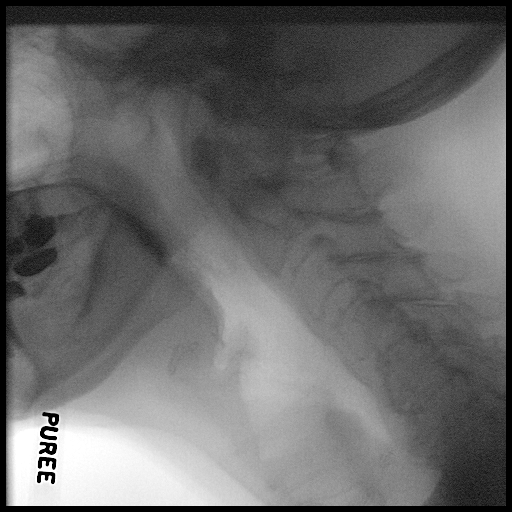
[frame 244/286]
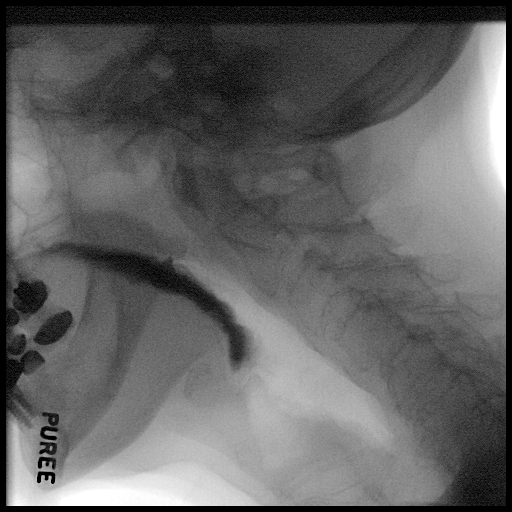

[Series 9: cp_standard · 1 of 124 frames shown (9 of 13)]
[frame 19/124]
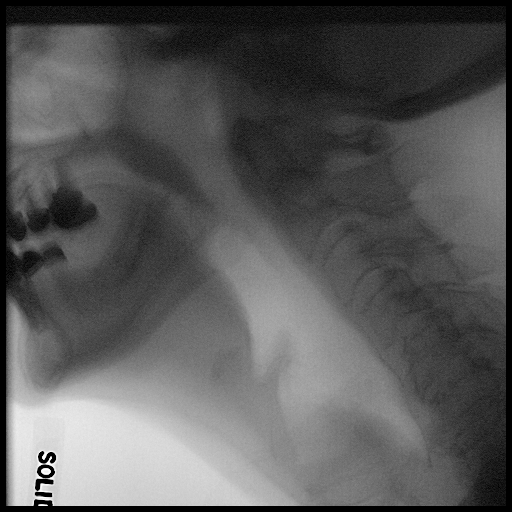

[Series 10: cp_standard · 1 of 157 frames shown (10 of 13)]
[frame 79/157]
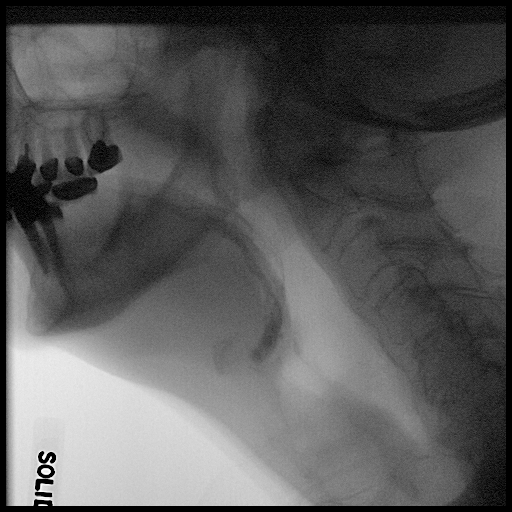

[Series 11: cp_standard · 2 of 79 frames shown (11 of 13)]
[frame 1/79]
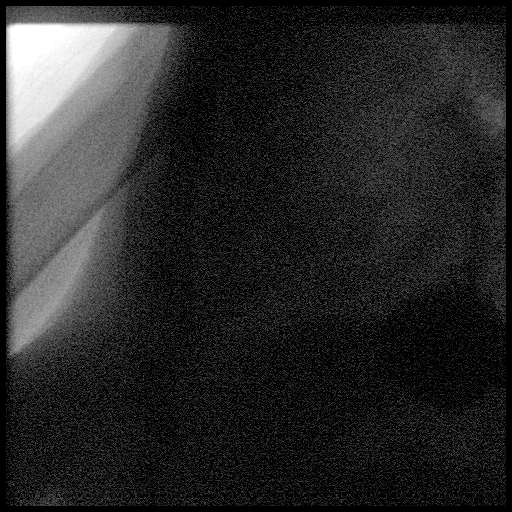
[frame 40/79]
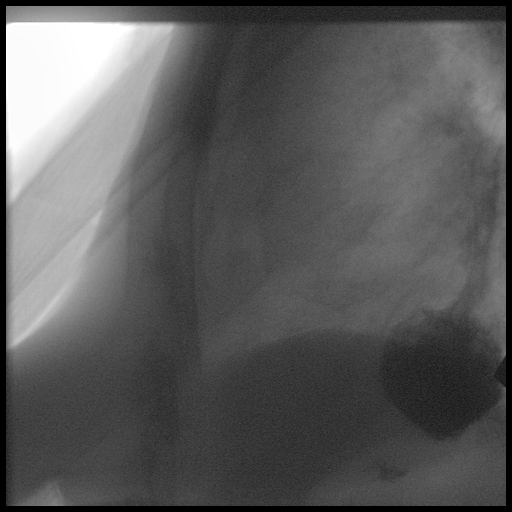

[Series 12: cp_standard · 1 of 35 frames shown (12 of 13)]
[frame 6/35]
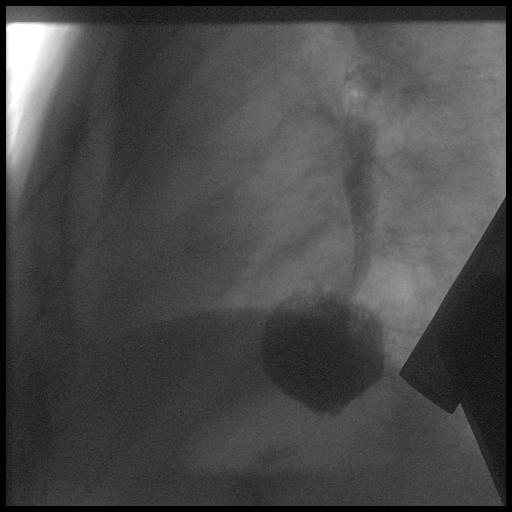

[Series 13: cp_standard · 2 of 121 frames shown (13 of 13)]
[frame 19/121]
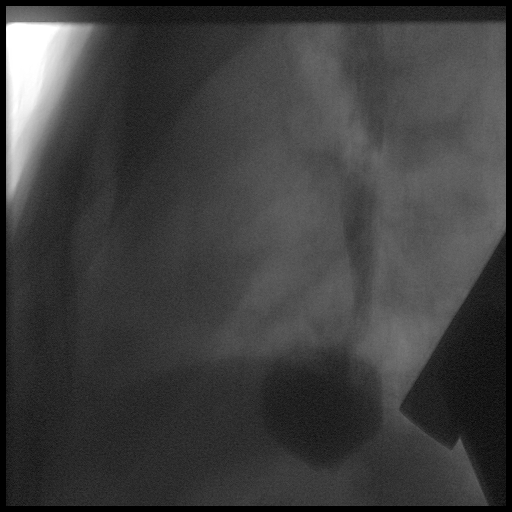
[frame 103/121]
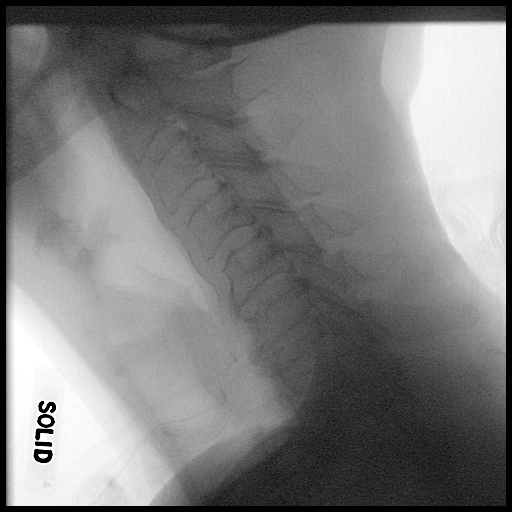

[19 of 24 positions shown; findings below may reference images not displayed]

FINDINGS: Different consistencies of barium were administered orally to the
patient by the Speech Pathologist with fluoroscopic imaging of the
pharynx, as well as limited imaging of the esophagus, from a lateral
projection. The radiologist was present in the fluoroscopy room for
this study, providing personal supervision. Laryngeal penetration
was observed. However, there was no frank aspiration. Please refer
to the Speech Pathologist's report for full details.
IMPRESSION: Modified barium swallow, as described.

Laryngeal penetration without frank aspiration. Please refer to the
Speech Pathologist's report for complete details and
recommendations.

Given the known esophageal diverticulum, and given the reported
history of regurgitation of puree and solid consistency foods,
consider an esophagram for further evaluation.

## 2022-07-06 NOTE — Progress Notes (Signed)
[facility] Palliative Care Consult Note Telephone: [phone]  Fax: [phone]   Date of encounter: 07/06/22 10:56 AM PATIENT NAME: Amanda Hurley [address] [id number]   [phone] (home)  DOB: Apr 05, 1929 MRN: [id number] PRIMARY CARE PROVIDER:    Burnard Bunting, MD,  [place] [address] [phone]  REFERRING PROVIDER:   Burnard Bunting, [place],  [place] [address] [phone]  RESPONSIBLE PARTY:    Contact Information     Name Relation Home [place] Daughter [phone]  [phone]   Phelicia, [place] [phone]  [phone]        I met face to face with patient and family in [facility] [place] [facility]. Palliative Care was asked to follow this patient by consultation request of  Burnard Bunting, MD to address advance care planning and complex medical decision making. This is the initial visit.          ASSESSMENT, SYMPTOM MANAGEMENT AND PLAN / RECOMMENDATIONS:  Dementia/protein calorie malnutrition Fast 7 score 6e. Decreased mobility currently due to fall with femur fracture and follow up surgery.  Acute cough post op due to atelectasis Have staff educate and do incentive spirometer QID  S/p ORIF left femur fracture Per ortho, WBAT LLE. Continue Lovenox daily x 4 weeks, per d/c note to 07/22/22 then start ASA 81 mg daily Continue PT/OT  Palliative Care Encounter Son and daughter want full code with intubation, full antibiotics and IV fluids if indicated while in SNF with no feeding tube. Family plans to resume DNR at D/C. Plan to return visit on 07/17/22 to identify any home needs to help prior to home  Follow up Palliative Care Visit: Palliative care will continue to follow for complex medical decision making, advance care planning, and clarification of goals. Return 07/17/22 or prn.    This visit was coded based on  medical decision making (MDM).  PPS: 40%  HOSPICE ELIGIBILITY/DIAGNOSIS: TBD  Chief Complaint:  Amanda Hurley received a referral to follow up with patient for chronic disease management in setting of dementia.  Palliative Care is following to assist with advance directive planning and defining/refining goals of care.  HISTORY OF PRESENT ILLNESS:  Amanda Hurley is a 88 y.o. year old female with dementia, esophageal diverticulum and dysmotility on pureed diet with hx of impaction with other textures, CKD stage 3a, abnormal weight loss, hallucinations, insomnia.  Pt had recent hospitalization after a fall for ORIF of closed comminuted left intertrochanteric femur fracture. Denies pain, SOB, nausea, vomiting, constipation. Endorses dysuria but currently on Keflex for UTI.  Son Rush Landmark and daughter [name] are in the pt's room on arrival.  Pt was followed by Palliative at home prior to fall.  She has had significant difficulty with swallowing and was getting annual botox injections which were helping from Dr Watt Climes but son says he no longer wants to give them to her.  Advised family that her condition with dementia is likely progressive. Advised that feeding tube would not address this issue.  Son and daughter state that they would not approve a feeding tube because their father had one and it didn't help.  Advised that current MOST says feeding tube on a time limited basis.  Both son and daughter share [place].  Daughter states they have a DNR at home but wanted CPR and intubation in the facility and plan to resume DNR status at home.  Advised that pt if coded will likely experience a decline in function and may or may not get back to prior level of function.  They want the orders to stand for now in the facility except they do not want a feeding tube. New MOST completed and uploaded to [facility] in [place]. Daughter states pt has a few areas of skin breakdown on her backside which is being  addressed by wound care nurse.  Advised that pt needs to change positions every couple of hours while awake to help prevent skin breakdown.  Son states that pt has had a little cough and that last night he was hearing some wheezing on expiration. Pt's son and daughter states that she will be private pay as of 07/20/22 and their wish is to take her home.  She has [employer]. Facility staff indicate that they do have incentive spirometer and can have pt do it. Pt did not recognize her son.  She has had recent post op follow up with Orthopedist indicating WBAT.  Per d/c instructions pt was to receive Lovenox [place] daily and then ASA.  She continues on Keflex for UTI.  History obtained from review of EMR, discussion with family, facility staff and/or Ms. Maranan.   Recent Data from [place] Related to [facility] Component 06/29/22 06/28/22 06/27/22 06/26/22 06/25/22 06/25/22  WBC 11.1 High  9.3 7.8 10.5 11.0 12.2 High   RBC 3.23 Low  3.02 Low  2.84 Low  2.78 Low  2.94 Low  2.72 Low   Hemoglobin 9.7 Low  9.2 Low  8.5 Low  8.3 Low  8.6 Low  8.1 Low   Hematocrit 29.8 Low  27.8 Low  26.0 Low  25.0 Low  26.4 Low  24.2 Low   MCV 92.2 92.3 91.6 89.9 89.7 88.8  MCH 30.1 30.3 30.0 29.7 29.2 29.7  MCHC 32.7 Low  32.9 Low  32.7 Low  33.0 32.5 Low  33.4  RDW 14.3 14.3 13.9 14.1 13.8 13.8  Platelets 173 167 155 152 156 141 Low   MPV 7.9 8.4 8.7 8.5 8.7 9.3  Recent Data from [place] Related to Basic Metabolic Panel Component 75/64/33 06/28/22 06/27/22 06/26/22 06/25/22 06/25/22  Sodium 141 142 141 140 139 135  Potassium 3.8 3.7 3.5 3.9 3.7 4.1  Chloride 110 111 High  109 108 105 104  CO2 _0 BUN _1 High  28 High   Glucose 95  90  123 High   88  100 High   92   Creatinine 0.61 0.67 0.66 0.65 0.63 0.62  Calcium 8.4 Low  8.2 Low  8.0 Low  8.1 Low  8.5 8.5  Anion Gap _2 Low   Est. GFR 85  83  83  84  84  85    Lactic Acid Specimen: Blood   Ref Range & Units 10 d ago  Lactic Acid 0.5 - 2.0 MMOL/L 1.5  Resulting Agency  [place]  Specimen Collected: 06/26/22 01:16   Performed by: [place] Last Resulted: 06/26/22 01:44  Received From: [place]  Result Received: 06/26/22 08:54   View Encounter   Recent Data from [place] Related to Lactic Acid Component 06/26/22 06/25/22  Lactic Acid 1.5 2.5 High    known Abnormality        Urine Hyaline Casts -- -- -- -- --  Urine Granular Casts -- -- -- -- --  Urine Cellular Casts -- -- -- -- --  Waxy Casts, UA -- -- -- -- --  Urine Broad Casts -- -- -- -- --  UR RBC CASTS -- -- -- -- --  UR WBC CASTS -- -- -- -- --  UFATTY CASTS -- -- -- -- --  Urine Epithelial Casts -- -- -- -- --  Urine Trichomonas -- -- -- -- --  Urine Spermatozoa -- -- -- -- --  Urine Yeast Hyphae -- -- -- -- --  Urine Yeast -- -- -- -- --  Urine Calcium Oxalate Crystals -- -- -- -- --  Urine Triple Phosphate Crystals -- -- -- -- --  UR CALPHOS CRYSTALS -- -- -- -- --  URIC ACID CRYSTALS -- -- -- -- --  CALCIUM CARBONATE CRYSTALS -- -- -- -- --  Urine Leucine Crystals -- -- -- -- --  Urine Cystine Crystals -- -- -- -- --  Urine Tyrosine Crystals -- -- -- -- --  Urine Other Crystals -- -- -- -- --  Urine Oval Fat Bodies -- -- -- -- --  Urine Fat -- -- -- -- --  Protein By SSA, UA -- -- -- -- --  Urine Culture Specimen: Urine - Urine specimen (specimen) Component 11 d ago  Urine culture ID 10,[id number],000 CFU/mL Pseudomonas aeruginosa Abnormal   Resulting Agency [facility]  Susceptibility  Organism Antibiotic Method Susceptibility  Pseudomonas aeruginosa Organism ID MICRO MIC SUSCEPTIBILITY   Pseudomonas aeruginosa Amikacin MICRO MIC SUSCEPTIBILITY <=16: Susceptible  Pseudomonas aeruginosa Cefepime MICRO MIC SUSCEPTIBILITY 4: Susceptible  Pseudomonas aeruginosa Ceftazidime MICRO MIC  SUSCEPTIBILITY <=1: Susceptible  Pseudomonas aeruginosa Ciprofloxacin MICRO MIC SUSCEPTIBILITY 2: Resistant  Pseudomonas aeruginosa Meropenem MICRO MIC SUSCEPTIBILITY <=1: Susceptible  Pseudomonas aeruginosa Piperacillin/Tazobactam MICRO MIC SUSCEPTIBILITY <=8: Susceptible  Pseudomonas aeruginosa Tobramycin MICRO MIC SUSCEPTIBILITY <=2: Susceptible  Comment: Identification testing performed by [employer] matrix-assisted laser desorption/ionization time of flight(MALDI-TOF)mass spectrometry. Antimicrobial susceptibility testing performed by [facility]  Specimen Collected: 06/25/22 16:53   Performed by: [place] Last Resulted: 06/28/22 07:46  Received From: [place]  Result Received: 07/03/22 14:37    06/22/22 CXR  IMPRESSION:  1. Low lung volumes with bronchovascular crowding and chronic  bronchial thickening.  2. Streaky atelectasis in the lung bases.   06/22/22 Left hip xray wwo pelvis: FINDINGS:  Displaced and comminuted intertrochanteric proximal femur fracture.  There is involvement of the lesser and greater trochanters. Mild  apex lateral angulation and proximal migration of the femoral shaft.  Femoral head remains seated. Intact pubic rami. Pubic symphysis and sacroiliac joints are congruent. Overall bony under mineralization.   IMPRESSION:  Displaced and comminuted intertrochanteric proximal femur fracture with involvement of the lesser and greater trochanters.          I reviewed EMR for available labs, medications, imaging, studies and related documents.  No new records since last visit/Records reviewed and summarized above.   ROS-obtained from pt and family at bedside General: NAD ENMT: endorses dysphagia Cardiovascular: denies chest pain, denies DOE Pulmonary: non-productive cough intermittently, denies increased SOB Abdomen: endorses poor appetite with only intake Boost supplements, denies  constipation, endorses incontinence of bowel GU: endorses dysuria and incontinence of urine MSK:  endorse increased weakness of BLE, no falls at facility Skin: Has wound of buttocks being treated by wound care nurse Neurological: denies pain, denies insomnia Psych: Endorses positive mood  Heme/lymph/immuno: denies bruises, abnormal bleeding  Physical Exam: Current and past weights: 06/22/22 weigh 124 lbs 9 oz, weight was 116 lbs on 01/03/22 Constitutional: NAD General: frail appearing, thin   ENMT: hard of hearing CV: S1S2, RRR without MRG, no LE edema Pulmonary: CTAB, no increased work of breathing, no cough, room air Abdomen: hypo-active BS + 4 quadrants, soft and non tender, no ascites GU: deferred MSK: no sarcopenia, moves all extremities Skin: warm and dry, left hip and upper thigh incision CDI to a few steristrips Neuro:  noted generalized weakness, Significant cognitive impairment Psych: non-anxious affect, A and O to self Hem/lymph/immuno: no widespread bruising  CURRENT PROBLEM LIST:  Patient Active Problem List   Diagnosis Date Noted   Hallucinations 10/11/2021   Hypertension 10/11/2021   Insomnia 10/11/2021   Esophageal dysmotility 09/20/2021   Esophageal diverticulum 09/20/2021   Abnormal weight loss 09/20/2021   Chronic kidney disease, stage 3a ([place]) 10/21/2019   Fatigue 07/10/2019   Dementia with behavioral disturbance ([place]) 04/10/2019   Unexplained night sweats 12/23/2016   Abnormal CXR 12/22/2016   PAST MEDICAL HISTORY:  Active Ambulatory Problems    Diagnosis Date Noted   Abnormal CXR 12/22/2016   Unexplained night sweats 12/23/2016   Dementia with behavioral disturbance ([place]) 04/10/2019   Esophageal dysmotility 09/20/2021   Esophageal diverticulum 09/20/2021   Abnormal weight loss 09/20/2021   Chronic kidney disease, stage 3a ([place]) 10/21/2019   Fatigue 07/10/2019   Hallucinations 10/11/2021   Hypertension 10/11/2021   Insomnia 10/11/2021   Resolved  Ambulatory Problems    Diagnosis Date Noted   No Resolved Ambulatory Problems   Past Medical History:  Diagnosis Date   AAA (abdominal aortic aneurysm) (HCC)    Dementia (HCC)    Dysphagia    Hyperlipidemia    SOCIAL HX:  Social History   Tobacco Use   Smoking status: Never   Smokeless tobacco: Never  Substance Use Topics   Alcohol use: No    Alcohol/week: 0.0 standard drinks of alcohol   FAMILY HX:  Family History  Problem Relation Age of Onset   Hypertension Mother    Arthritis Mother    Glaucoma Mother    Stroke Mother    CAD Father    Arthritis Father    Hypertension Father    Dementia Brother    Cancer - Other Brother    Leukemia Brother    Breast cancer Neg Hx        Preferred Pharmacy: ALLERGIES: No Known Allergies   PERTINENT MEDICATIONS:  Outpatient Encounter Medications as of 07/06/2022  Medication Sig   carbamide peroxide (DEBROX) 6.5 % OTIC solution 3-4 drops   cephALEXin (KEFLEX) 125 MG/5ML suspension Take by mouth.   No facility-administered encounter medications on file as of 07/06/2022.     ------------------------------------------------------------------------------------------ Advance Care Planning/Goals of Care: Goals include to maximize quality of life and symptom management.  Son and daughter gave permission to discuss goals of care. The value and importance of advance care planning-has MOST.  [name] states that at home they want pt to remain DNR but while in facility full code, full intubation but they did not want feeding tube.  Understand that dementia is progressive and that esophageal dysmotility is likely progressive given withdrawal of botox and dementia.  Advised that with resuscitation if successful there will likely be a decline in function and pt will likely not get back to prior baseline. They want current MOST form to stand for full intervention with CPR and intubation  but plan to resume DNR status at home. Experiences with loved  ones who have been seriously ill or have died-the patient's spouse had a feeding tube and it was not helpful.  Advised this was only helpful short term if you could resolve the underlying problem but given her dysmotility, diverticulum and dementia that this would likely not be effective.   Identification  of a healthcare agent-daughter [name] and son Rush Landmark Review of an advance directive document-MOST as of 06/29/22 . CODE STATUS: Full code, full intubation Antibiotics and IV fluids if indicated Feeding tube on a time limited trial basis    Thank you for the opportunity to participate in the care of Ms. Nightengale.  The palliative care team will continue to follow. Please call our office at [phone] if we can be of additional assistance.   Marijo Conception, FNP-C  COVID-19 PATIENT SCREENING TOOL Asked and negative response unless otherwise noted:  Have you had symptoms of covid, tested positive or been in contact with someone with symptoms/positive test in the past 5-10 days?  unknown

## 2022-09-29 DEATH — deceased
